# Patient Record
Sex: Male | Born: 1955
Health system: Southern US, Community
[De-identification: ages and names within clinical notes are randomized; demographics above are authoritative.]

## PROBLEM LIST (undated history)

## (undated) DIAGNOSIS — I10 Essential (primary) hypertension: Secondary | ICD-10-CM

## (undated) DIAGNOSIS — Z87891 Personal history of nicotine dependence: Secondary | ICD-10-CM

## (undated) DIAGNOSIS — R079 Chest pain, unspecified: Secondary | ICD-10-CM

## (undated) DIAGNOSIS — J439 Emphysema, unspecified: Secondary | ICD-10-CM

## (undated) DIAGNOSIS — K219 Gastro-esophageal reflux disease without esophagitis: Secondary | ICD-10-CM

## (undated) HISTORY — PX: CHOLECYSTECTOMY: SHX55

## (undated) HISTORY — DX: Emphysema, unspecified: J43.9

## (undated) HISTORY — DX: Gastro-esophageal reflux disease without esophagitis: K21.9

## (undated) HISTORY — DX: Chest pain, unspecified: R07.9

## (undated) HISTORY — DX: Personal history of nicotine dependence: Z87.891

## (undated) HISTORY — PX: FRACTURE SURGERY: SHX138

## (undated) HISTORY — PX: OTHER SURGICAL HISTORY: SHX169

---

## 2006-10-21 ENCOUNTER — Encounter (INDEPENDENT_AMBULATORY_CARE_PROVIDER_SITE_OTHER): Payer: Self-pay | Admitting: General Surgery

## 2006-10-21 ENCOUNTER — Ambulatory Visit (HOSPITAL_COMMUNITY): Admission: RE | Admit: 2006-10-21 | Discharge: 2006-10-21 | Payer: Self-pay | Admitting: General Surgery

## 2007-08-09 ENCOUNTER — Emergency Department (HOSPITAL_COMMUNITY): Admission: EM | Admit: 2007-08-09 | Discharge: 2007-08-09 | Payer: Self-pay | Admitting: Emergency Medicine

## 2009-07-09 ENCOUNTER — Ambulatory Visit: Payer: Self-pay | Admitting: Diagnostic Radiology

## 2009-07-09 ENCOUNTER — Emergency Department (HOSPITAL_BASED_OUTPATIENT_CLINIC_OR_DEPARTMENT_OTHER): Admission: EM | Admit: 2009-07-09 | Discharge: 2009-07-09 | Payer: Self-pay | Admitting: Emergency Medicine

## 2010-07-11 NOTE — Op Note (Signed)
NAME:  Casey James, Casey James               ACCOUNT NO.:  [id number]   MEDICAL RECORD NO.:  [id number]          PATIENT TYPE:  AMB   LOCATION:  SDS                          FACILITY:  [facility]   PHYSICIAN:  Gabrielle Dare. Janee Morn, M.D.DATE OF BIRTH:  02-19-1956   DATE OF PROCEDURE:  10/21/2006  DATE OF DISCHARGE:                               OPERATIVE REPORT   PREOPERATIVE DIAGNOSES:  1. Symptomatic cholelithiasis.  2. Cyst, left posterior neck.   POSTOPERATIVE DIAGNOSES:  1. Symptomatic cholelithiasis.  2. Cyst, left posterior neck.   PROCEDURES:  1. Laparoscopic cholecystectomy with intraoperative cholangiogram.  2. Excision of cyst left posterior neck 5 cm with layered closure.   SURGEON:  Gabrielle Dare. Janee Morn, M.D.   ANESTHESIA:  General.   HISTORY OF PRESENT ILLNESS:  Casey James is a 55 year old gentleman who  I evaluated in the office for right upper quadrant pain.  Workup  included liver function tests, which were normal.  Abdominal ultrasound,  however, showed multiple gallstones.  We planned elective  cholecystectomy.  In addition, he has a cyst on the back of his neck on  the left side that has gotten progressively larger over the years and he  has requested excision of that as well.  He presents for both electively  today.   PROCEDURE IN DETAIL:  Informed consent was obtained.  The patient's cyst  site was marked.  He received intravenous antibiotics.  He was brought  to the operating room.  General anesthesia was administered.  Attention  was first directed to the cholecystectomy.  His abdomen was prepped and  draped in a sterile fashion.  The infraumbilical area was infiltrated  with 0.25% Marcaine with epinephrine and infraumbilical incision was  made.  Subcutaneous tissues were dissected down revealing the anterior  fascia.  This was divided sharply.  The peritoneal cavity was then  entered under direct vision without difficulty.  A 0 Vicryl pursestring  suture was placed  around the fascial opening and the Hasson trocar was  inserted into the abdomen.  The abdomen was insufflated with carbon  dioxide in a standard fashion.  Under direct vision, an 11-mm epigastric  and two 5-mm lateral ports were placed.  The 0.25% Marcaine with  epinephrine was used at all port sites.  The dome of the gallbladder was  retracted superomedially.  There was some filmy omental adhesions taken  down off the body of the gallbladder.  These were swept away without  difficulty revealing the infundibulum.  The infundibulum was retracted  inferolaterally.  Dissection began laterally and progressed medially.  This identified the cystic duct easily and we continued dissecting.  There was a more posterior located cystic artery that was also dissected  and dissection in the cystic duct continued until a large window was  created between the cystic duct, the infundibulum of the gallbladder and  the liver.  Once this was done with excellent visualization, the clip  was placed on the infundibulocystic duct junction.  A small nick was  made in the cystic duct and a Reddick cholangiogram catheter was  inserted.  Intraoperative cholangiogram  was obtained demonstrating no  common bile duct filling defects and good flow of contrast into the  duodenum.  There was a decent length of cystic duct.  Angiogram catheter  was removed.  Three clips were placed proximally on the cystic duct and  it was divided.  The cystic artery that we had previously dissected was  then clipped twice proximally and once distally.  The gallbladder was  dissected off the liver bed with Bovie cautery.  We did find a small  second branch of the cystic artery.  It was clipped twice proximally and  divided distally with cautery.  The gallbladder was taken off the liver  bed with a Bovie cautery and placed in an EndoCatch bag and was taken  out of the abdomen via the infraumbilical port site.  The liver bed was  copiously  irrigated.  Hemostasis was obtained with Bovie cautery.  The  irrigation fluid returned clear.  The irrigation fluid was evacuated and  the liver bed was rechecked and excellent hemostasis was present.  All  clips remained in excellent position.  The remainder of the irrigation  fluid was evacuated and it was clear.  We did recheck the liver bed one  more time and it remained dry with clips in good position.  The ports  were removed under direct vision.  The pneumoperitoneum was released.  The Hasson trocar was removed.  The infraumbilical fascia was closed by  tying a 0 Vicryl pursestring suture.  All 4 wounds were copiously  irrigated and the skin of each was closed with a running 4-0 Vicryl  subcuticular stitch.  Sponge, needle, and instrument counts were  correct.  Benzoin, Steri-Strips and sterile dressings were applied.  The  patient tolerated this portion of the procedure.   He was then undraped.  He was placed in a right lateral position and his  posterior neck was prepped and draped in a sterile fashion.  A  transverse incision was made over the large mass in the posterior neck.  Subcutaneous tissues were dissected down.  This was a large encapsulated  mass.  It was circumferentially dissected without violating it achieving  hemostasis with Bovie cautery.  It was dissected off the fascia  underneath and was removed in one piece.  It was sent to pathology.  The  wound was copiously irrigated.  Meticulous hemostasis was obtained.  The  wound was then closed in layers with subcutaneous tissues approximated  with 3-0 Vicryl sutures in an interrupted fashion.  Tacking the tissues  down to the underlying fascia and subcutaneous tissues were irrigated.  Some initial local anesthetic was injected and the skin was closed with  a running 4-0 Monocryl subcuticular stitch.  Sponge, needle, and  instrument counts were again correct.  Benzoin, Steri-Strips and sterile  dressing were applied  and the patient tolerated the procedure well  without apparent complication, and was taken to the recovery room in  stable condition.      Gabrielle Dare Janee Morn, M.D.  Electronically Signed     BET/MEDQ  D:  10/21/2006  T:  10/21/2006  Job:  [id number]   cc:   Nilda Simmer, M.D.

## 2010-11-23 LAB — POCT I-STAT, CHEM 8
BUN: 11
HCT: 46
Sodium: 137

## 2010-11-23 LAB — POCT CARDIAC MARKERS
Myoglobin, poc: 130
Myoglobin, poc: 252
Operator id: 264421
Operator id: 264421
Troponin i, poc: <0.05

## 2010-12-08 LAB — CBC
HCT: 44.8
MCHC: 34.5
MCV: 91
RDW: 14.6 — ABNORMAL HIGH
WBC: 12.3 — ABNORMAL HIGH

## 2011-07-13 ENCOUNTER — Ambulatory Visit (INDEPENDENT_AMBULATORY_CARE_PROVIDER_SITE_OTHER): Payer: BC Managed Care – PPO | Admitting: Family Medicine

## 2011-07-13 VITALS — BP 132/78 | HR 73 | Temp 98.0°F | Resp 16 | Ht 69.5 in | Wt 188.0 lb

## 2011-07-13 DIAGNOSIS — H02401 Unspecified ptosis of right eyelid: Secondary | ICD-10-CM | POA: Insufficient documentation

## 2011-07-13 DIAGNOSIS — H02409 Unspecified ptosis of unspecified eyelid: Secondary | ICD-10-CM

## 2011-07-13 DIAGNOSIS — R5383 Other fatigue: Secondary | ICD-10-CM

## 2011-07-13 DIAGNOSIS — R5381 Other malaise: Secondary | ICD-10-CM

## 2011-07-13 DIAGNOSIS — Z0000 Encounter for general adult medical examination without abnormal findings: Secondary | ICD-10-CM

## 2011-07-13 DIAGNOSIS — Z23 Encounter for immunization: Secondary | ICD-10-CM

## 2011-07-13 LAB — POCT UA - MICROSCOPIC ONLY
Bacteria, U Microscopic: NEGATIVE
Casts, Ur, LPF, POC: NEGATIVE
Crystals, Ur, HPF, POC: NEGATIVE
Mucus, UA: NEGATIVE
RBC, urine, microscopic: NEGATIVE
WBC, Ur, HPF, POC: NEGATIVE
Yeast, UA: NEGATIVE

## 2011-07-13 LAB — TSH: TSH: 1.615 u[IU]/mL (ref 0.350–4.500)

## 2011-07-13 LAB — POCT URINALYSIS DIPSTICK
Bilirubin, UA: NEGATIVE
Blood, UA: NEGATIVE
Glucose, UA: NEGATIVE
Ketones, UA: NEGATIVE
Leukocytes, UA: NEGATIVE
Nitrite, UA: NEGATIVE
Protein, UA: NEGATIVE
Spec Grav, UA: 1.01
Urobilinogen, UA: 0.2
pH, UA: 6.5

## 2011-07-13 LAB — COMPREHENSIVE METABOLIC PANEL
ALT: 16 U/L (ref 0–53)
AST: 23 U/L (ref 0–37)
Albumin: 4.4 g/dL (ref 3.5–5.2)
Alkaline Phosphatase: 104 U/L (ref 39–117)
BUN: 8 mg/dL (ref 6–23)
CO2: 24 mEq/L (ref 19–32)
Calcium: 9.1 mg/dL (ref 8.4–10.5)
Chloride: 104 mEq/L (ref 96–112)
Creat: 0.95 mg/dL (ref 0.50–1.35)
Glucose, Bld: 85 mg/dL (ref 70–99)
Potassium: 4.3 mEq/L (ref 3.5–5.3)
Sodium: 139 mEq/L (ref 135–145)
Total Bilirubin: 0.7 mg/dL (ref 0.3–1.2)
Total Protein: 7.6 g/dL (ref 6.0–8.3)

## 2011-07-13 LAB — POCT CBC
Granulocyte percent: 61.7 %G (ref 37–80)
HCT, POC: 45.3 % (ref 43.5–53.7)
Hemoglobin: 15.4 g/dL (ref 14.1–18.1)
Lymph, poc: 2 (ref 0.6–3.4)
MCH, POC: 31.4 pg — AB (ref 27–31.2)
MCHC: 34 g/dL (ref 31.8–35.4)
MCV: 92.3 fL (ref 80–97)
MID (cbc): 0.8 (ref 0–0.9)
MPV: 9.6 fL (ref 0–99.8)
POC Granulocyte: 4.5 (ref 2–6.9)
POC LYMPH PERCENT: 27.9 %L (ref 10–50)
POC MID %: 10.4 %M (ref 0–12)
Platelet Count, POC: 228 10*3/uL (ref 142–424)
RBC: 4.91 M/uL (ref 4.69–6.13)
RDW, POC: 13.7 %
WBC: 7.3 10*3/uL (ref 4.6–10.2)

## 2011-07-13 LAB — LIPID PANEL
Cholesterol: 196 mg/dL (ref 0–200)
HDL: 27 mg/dL — ABNORMAL LOW (ref >39)
LDL Cholesterol: 123 mg/dL — ABNORMAL HIGH (ref 0–99)
Total CHOL/HDL Ratio: 7.3 Ratio
Triglycerides: 231 mg/dL — ABNORMAL HIGH (ref <150)
VLDL: 46 mg/dL — ABNORMAL HIGH (ref 0–40)

## 2011-07-13 LAB — TESTOSTERONE: Testosterone: 346.97 ng/dL (ref 300–890)

## 2011-07-13 LAB — PSA: PSA: 1.81 ng/mL (ref <=4.00)

## 2011-07-13 LAB — IFOBT (OCCULT BLOOD): IFOBT: NEGATIVE

## 2011-07-13 MED ORDER — TETANUS-DIPHTH-ACELL PERTUSSIS 5-2.5-18.5 LF-MCG/0.5 IM SUSP: 0.5000 mL | Freq: Once | INTRAMUSCULAR | Status: DC

## 2011-07-13 NOTE — Patient Instructions (Addendum)
Health Maintenance, Males A healthy lifestyle and preventative care can promote health and wellness.  Maintain regular health, dental, and eye exams.   Eat a healthy diet. Foods like vegetables, fruits, whole grains, low-fat dairy products, and lean protein foods contain the nutrients you need without too many calories. Decrease your intake of foods high in solid fats, added sugars, and salt. Get information about a proper diet from your caregiver, if necessary.   Regular physical exercise is one of the most important things you can do for your health. Most adults should get at least 150 minutes of moderate-intensity exercise (any activity that increases your heart rate and causes you to sweat) each week. In addition, most adults need muscle-strengthening exercises on 2 or more days a week.    Maintain a healthy weight. The body mass index (BMI) is a screening tool to identify possible weight problems. It provides an estimate of body fat based on height and weight. Your caregiver can help determine your BMI, and can help you achieve or maintain a healthy weight. For adults 20 years and older:   A BMI below 18.5 is considered underweight.   A BMI of 18.5 to 24.9 is normal.   A BMI of 25 to 29.9 is considered overweight.   A BMI of 30 and above is considered obese.   Maintain normal blood lipids and cholesterol by exercising and minimizing your intake of saturated fat. Eat a balanced diet with plenty of fruits and vegetables. Blood tests for lipids and cholesterol should begin at age 20 and be repeated every 5 years. If your lipid or cholesterol levels are high, you are over 50, or you are a high risk for heart disease, you may need your cholesterol levels checked more frequently.Ongoing high lipid and cholesterol levels should be treated with medicines, if diet and exercise are not effective.   If you smoke, find out from your caregiver how to quit. If you do not use tobacco, do not start.    If you choose to drink alcohol, do not exceed 2 drinks per day. One drink is considered to be 12 ounces (355 mL) of beer, 5 ounces (148 mL) of wine, or 1.5 ounces (44 mL) of liquor.   Avoid use of street drugs. Do not share needles with anyone. Ask for help if you need support or instructions about stopping the use of drugs.   High blood pressure causes heart disease and increases the risk of stroke. Blood pressure should be checked at least every 1 to 2 years. Ongoing high blood pressure should be treated with medicines if weight loss and exercise are not effective.   If you are 45 to 57 years old, ask your caregiver if you should take aspirin to prevent heart disease.   Diabetes screening involves taking a blood sample to check your fasting blood sugar level. This should be done once every 3 years, after age 45, if you are within normal weight and without risk factors for diabetes. Testing should be considered at a younger age or be carried out more frequently if you are overweight and have at least 1 risk factor for diabetes.   Colorectal cancer can be detected and often prevented. Most routine colorectal cancer screening begins at the age of 50 and continues through age 75. However, your caregiver may recommend screening at an earlier age if you have risk factors for colon cancer. On a yearly basis, your caregiver may provide home test kits to check for hidden   blood in the stool. Use of a small camera at the end of a tube, to directly examine the colon (sigmoidoscopy or colonoscopy), can detect the earliest forms of colorectal cancer. Talk to your caregiver about this at age 71, when routine screening begins. Direct examination of the colon should be repeated every 5 to 10 years through age 34, unless early forms of pre-cancerous polyps or small growths are found.   Hepatitis C blood testing is recommended for all people born from [birth year] through 1966 and any individual with known risks for  hepatitis C.   Healthy men should no longer receive prostate-specific antigen (PSA) blood tests as part of routine cancer screening. Consult with your caregiver about prostate cancer screening.   Testicular cancer screening is not recommended for adolescents or adult males who have no symptoms. Screening includes self-exam, caregiver exam, and other screening tests. Consult with your caregiver about any symptoms you have or any concerns you have about testicular cancer.   Practice safe sex. Use condoms and avoid high-risk sexual practices to reduce the spread of sexually transmitted infections (STIs).   Use sunscreen with a sun protection factor (SPF) of 30 or greater. Apply sunscreen liberally and repeatedly throughout the day. You should seek shade when your shadow is shorter than you. Protect yourself by wearing long sleeves, pants, a wide-brimmed hat, and sunglasses year round, whenever you are outdoors.   Notify your caregiver of new moles or changes in moles, especially if there is a change in shape or color. Also notify your caregiver if a mole is larger than the size of a pencil eraser.   A one-time screening for abdominal aortic aneurysm (AAA) and surgical repair of large AAAs by sound wave imaging (ultrasonography) is recommended for ages 69 to 39 years who are current or former smokers.   Stay current with your immunizations.  Document Released: 08/11/2007 Document Revised: 02/01/2011 Document Reviewed: 07/10/2010 [facility] Patient Information 2012 [place], [place].Fatigue Fatigue is a feeling of tiredness, lack of energy, lack of motivation, or feeling tired all the time. Having enough rest, good nutrition, and reducing stress will normally reduce fatigue. Consult your caregiver if it persists. The nature of your fatigue will help your caregiver to find out its cause. The treatment is based on the cause.  CAUSES  There are many causes for fatigue. Most of the time, fatigue can be traced  to one or more of your habits or routines. Most causes fit into one or more of three general areas. They are: Lifestyle problems  Sleep disturbances.   Overwork.   Physical exertion.   Unhealthy habits.   Poor eating habits or eating disorders.   Alcohol and/or drug use .   Lack of proper nutrition (malnutrition).  Psychological problems  Stress and/or anxiety problems.   Depression.   Grief.   Boredom.  Medical Problems or Conditions  Anemia.   Pregnancy.   Thyroid gland problems.   Recovery from major surgery.   Continuous pain.   Emphysema or asthma that is not well controlled   Allergic conditions.   Diabetes.   Infections (such as mononucleosis).   Obesity.   Sleep disorders, such as sleep apnea.   Heart failure or other heart-related problems.   Cancer.   Kidney disease.   Liver disease.   Effects of certain medicines such as antihistamines, cough and cold remedies, prescription pain medicines, heart and blood pressure medicines, drugs used for treatment of cancer, and some antidepressants.  SYMPTOMS  The symptoms  of fatigue include:   Lack of energy.   Lack of drive (motivation).   Drowsiness.   Feeling of indifference to the surroundings.  DIAGNOSIS  The details of how you feel help guide your caregiver in finding out what is causing the fatigue. You will be asked about your present and past health condition. It is important to review all medicines that you take, including prescription and non-prescription items. A thorough exam will be done. You will be questioned about your feelings, habits, and normal lifestyle. Your caregiver may suggest blood tests, urine tests, or other tests to look for common medical causes of fatigue.  TREATMENT  Fatigue is treated by correcting the underlying cause. For example, if you have continuous pain or depression, treating these causes will improve how you feel. Similarly, adjusting the dose of certain  medicines will help in reducing fatigue.  HOME CARE INSTRUCTIONS   Try to get the required amount of good sleep every night.   Eat a healthy and nutritious diet, and drink enough water throughout the day.   Practice ways of relaxing (including yoga or meditation).   Exercise regularly.   Make plans to change situations that cause stress. Act on those plans so that stresses decrease over time. Keep your work and personal routine reasonable.   Avoid street drugs and minimize use of alcohol.   Start taking a daily multivitamin after consulting your caregiver.  SEEK MEDICAL CARE IF:   You have persistent tiredness, which cannot be accounted for.   You have fever.   You have unintentional weight loss.   You have headaches.   You have disturbed sleep throughout the night.   You are feeling sad.   You have constipation.   You have dry skin.   You have gained weight.   You are taking any new or different medicines that you suspect are causing fatigue.   You are unable to sleep at night.   You develop any unusual swelling of your legs or other parts of your body.  SEEK IMMEDIATE MEDICAL CARE IF:   You are feeling confused.   Your vision is blurred.   You feel faint or pass out.   You develop severe headache.   You develop severe abdominal, pelvic, or back pain.   You develop chest pain, shortness of breath, or an irregular or fast heartbeat.   You are unable to pass a normal amount of urine.   You develop abnormal bleeding such as bleeding from the rectum or you vomit blood.   You have thoughts about harming yourself or committing suicide.   You are worried that you might harm someone else.  MAKE SURE YOU:   Understand these instructions.   Will watch your condition.   Will get help right away if you are not doing well or get worse.  Document Released: 12/10/2006 Document Revised: 02/01/2011 Document Reviewed: 12/10/2006 [facility] Patient Information 2012  [place], [place].

## 2011-07-13 NOTE — Progress Notes (Signed)
  Patient Name: Casey James Date of Birth: 1955/10/10 Medical Record Number: [id number] Gender: male Date of Encounter: 07/13/2011  History of Present Illness:  Casey James is a 56 y.o. very pleasant male patient who presents with the following:  3 months Erectile dysfunction Ptosis which has been progressive over his entire life, right eye, also present in mother Posey Rea of last tetanus shot Refuses colonoscopy at this point, we'll consider when he has different insurance in the fall  Patient Active Problem List  Diagnoses  . Ptosis of eyelid, right   No past medical history on file. Past Surgical History  Procedure Date  . Cholecystectomy   . Fracture surgery   . Pulmonary fibrosis    History  Substance Use Topics  . Smoking status: Current Everyday Smoker  . Smokeless tobacco: Not on file  . Alcohol Use: Not on file   Family History  Problem Relation Age of Onset  . Cancer Mother   . Pulmonary fibrosis Mother   . Diabetes Brother   . Heart disease Father    No Known Allergies  Medication list has been reviewed and updated.  Review of Systems: Negative except for above  Physical Examination: Filed Vitals:   07/13/11 1316  BP: 132/78  Pulse: 73  Temp: 98 F (36.7 C)  TempSrc: Oral  Resp: 16  Height: 5' 9.5" (1.765 m)  Weight: 188 lb (85.276 kg)    Body mass index is 27.36 kg/(m^2).  Physical Examination: General appearance - alert, well appearing, and in no distress Mental status - alert, oriented to person, place, and time Eyes - pupils equal and reactive, extraocular eye movements intact, ptosis right eye Ears - bilateral TM's and external ear canals normal Nose - normal and patent, no erythema, discharge or polyps Mouth - mucous membranes moist, pharynx normal without lesions Neck - supple, no significant adenopathy Lymphatics - no palpable lymphadenopathy, no hepatosplenomegaly Chest - clear to auscultation, no wheezes, rales or rhonchi,  symmetric air entry Heart - normal rate, regular rhythm, normal S1, S2, no murmurs, rubs, clicks or gallops Abdomen - soft, nontender, nondistended, no masses or organomegaly GU Male - no penile lesions or discharge, no testicular masses or tenderness, no hernias Rectal - negative without mass, lesions or tenderness,circumcised.  Note:  Small subq nontender nodule right perineum Back exam - full range of motion, no tenderness, palpable spasm or pain on motion Neurological - alert, oriented, normal speech, no focal findings or movement disorder noted Musculoskeletal - no joint tenderness, deformity or swelling Extremities - peripheral pulses normal, no pedal edema, no clubbing or cyanosis Skin - normal coloration and turgor, no rashes, no suspicious skin lesions noted  Assessment and Plan: 1. Healthcare maintenance  TDaP (BOOSTRIX) injection 0.5 mL, POCT CBC, IFOBT POC (occult bld, rslt in office), POCT urinalysis dipstick, POCT UA - Microscopic Only, PSA, Comprehensive metabolic panel, Lipid panel  2. Ptosis of eyelid, right    3. Fatigue  Lipid panel, Testosterone

## 2012-07-16 ENCOUNTER — Ambulatory Visit (INDEPENDENT_AMBULATORY_CARE_PROVIDER_SITE_OTHER): Payer: BC Managed Care – PPO | Admitting: Family Medicine

## 2012-07-16 ENCOUNTER — Encounter: Payer: Self-pay | Admitting: Family Medicine

## 2012-07-16 VITALS — BP 140/70 | HR 69 | Temp 97.8°F | Resp 16 | Ht 69.5 in | Wt 178.2 lb

## 2012-07-16 DIAGNOSIS — E291 Testicular hypofunction: Secondary | ICD-10-CM

## 2012-07-16 DIAGNOSIS — R5383 Other fatigue: Secondary | ICD-10-CM

## 2012-07-16 DIAGNOSIS — R5381 Other malaise: Secondary | ICD-10-CM

## 2012-07-16 DIAGNOSIS — Z0000 Encounter for general adult medical examination without abnormal findings: Secondary | ICD-10-CM

## 2012-07-16 DIAGNOSIS — D234 Other benign neoplasm of skin of scalp and neck: Secondary | ICD-10-CM

## 2012-07-16 DIAGNOSIS — D2330 Other benign neoplasm of skin of unspecified part of face: Secondary | ICD-10-CM

## 2012-07-16 LAB — POCT URINALYSIS DIPSTICK
Bilirubin, UA: NEGATIVE
Blood, UA: NEGATIVE
Glucose, UA: NEGATIVE
Ketones, UA: NEGATIVE
Leukocytes, UA: NEGATIVE
Nitrite, UA: NEGATIVE
Protein, UA: NEGATIVE
Spec Grav, UA: <=1.005
Urobilinogen, UA: 0.2
pH, UA: 7

## 2012-07-16 LAB — LIPID PANEL
Cholesterol: 207 mg/dL — ABNORMAL HIGH (ref 0–200)
HDL: 36 mg/dL — ABNORMAL LOW (ref >39)
LDL Cholesterol: 135 mg/dL — ABNORMAL HIGH (ref 0–99)
Total CHOL/HDL Ratio: 5.8 Ratio
Triglycerides: 178 mg/dL — ABNORMAL HIGH (ref <150)
VLDL: 36 mg/dL (ref 0–40)

## 2012-07-16 LAB — CBC WITH DIFFERENTIAL/PLATELET
Basophils Absolute: 0 10*3/uL (ref 0.0–0.1)
Basophils Relative: 0 % (ref 0–1)
Eosinophils Absolute: 0.1 10*3/uL (ref 0.0–0.7)
Eosinophils Relative: 1 % (ref 0–5)
HCT: 45.3 % (ref 39.0–52.0)
Hemoglobin: 15.7 g/dL (ref 13.0–17.0)
Lymphocytes Relative: 21 % (ref 12–46)
Lymphs Abs: 2.4 10*3/uL (ref 0.7–4.0)
MCH: 30.7 pg (ref 26.0–34.0)
MCHC: 34.7 g/dL (ref 30.0–36.0)
MCV: 88.5 fL (ref 78.0–100.0)
Monocytes Absolute: 0.6 10*3/uL (ref 0.1–1.0)
Monocytes Relative: 5 % (ref 3–12)
Neutro Abs: 8 10*3/uL — ABNORMAL HIGH (ref 1.7–7.7)
Neutrophils Relative %: 73 % (ref 43–77)
Platelets: 194 10*3/uL (ref 150–400)
RBC: 5.12 MIL/uL (ref 4.22–5.81)
RDW: 14.6 % (ref 11.5–15.5)
WBC: 11.1 10*3/uL — ABNORMAL HIGH (ref 4.0–10.5)

## 2012-07-16 LAB — COMPREHENSIVE METABOLIC PANEL
ALT: 16 U/L (ref 0–53)
AST: 20 U/L (ref 0–37)
Albumin: 4.3 g/dL (ref 3.5–5.2)
Alkaline Phosphatase: 70 U/L (ref 39–117)
BUN: 8 mg/dL (ref 6–23)
CO2: 22 mEq/L (ref 19–32)
Calcium: 9.2 mg/dL (ref 8.4–10.5)
Chloride: 107 mEq/L (ref 96–112)
Creat: 0.87 mg/dL (ref 0.50–1.35)
Glucose, Bld: 98 mg/dL (ref 70–99)
Potassium: 4 mEq/L (ref 3.5–5.3)
Sodium: 140 mEq/L (ref 135–145)
Total Bilirubin: 0.9 mg/dL (ref 0.3–1.2)
Total Protein: 7 g/dL (ref 6.0–8.3)

## 2012-07-16 LAB — IFOBT (OCCULT BLOOD): IFOBT: NEGATIVE

## 2012-07-16 MED ORDER — TADALAFIL 20 MG PO TABS: 20.0000 mg | ORAL_TABLET | Freq: Every day | ORAL | Status: DC | PRN

## 2012-07-16 NOTE — Progress Notes (Signed)
Patient ID: Casey James MRN: [id number], DOB: 1955-06-14 57 y.o. Date of Encounter: 07/16/2012, 1:52 PM  Primary Physician: No primary provider on file.  Chief Complaint: Physical (CPE)  HPI: 57 y.o. y/o male with history noted below here for CPE.  Doing well. 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 Eyes: No visual changes, eye redness, or discharge. ENT/Mouth: Ears: No otalgia, tinnitus, hearing loss, discharge. Nose: No congestion, rhinorrhea, sinus pain, or epistaxis. Throat: No sore throat, post nasal drip, or teeth pain. Cardiovascular: No CP, palpitations, diaphoresis, DOE, edema, orthopnea, PND. Respiratory: No cough, hemoptysis, SOB, or wheezing. Gastrointestinal: No anorexia, dysphagia, reflux, pain, nausea, vomiting, hematemesis, diarrhea, constipation, BRBPR, or melena. Genitourinary: No dysuria, frequency, urgency, hematuria, incontinence,  decreased urinary stream, discharge, or testicular pain/masses.  Cialis works but very pricey Musculoskeletal: No decreased ROM, myalgias, stiffness, joint swelling, or weakness. Skin: No rash, erythema, pain, warmth, jaundice, or pruritis. Neurological: No headache, dizziness, syncope, seizures, tremors, memory loss, coordination problems, or paresthesias. Psychological: No anxiety, depression, hallucinations, SI/HI. Endocrine: No fatigue, polydipsia, polyphagia, polyuria, or known diabetes. All other systems were reviewed and are otherwise negative.  Past Medical History  Diagnosis Date  . Emphysema of lung      Past Surgical History  Procedure Laterality Date  . Cholecystectomy    . Fracture surgery    . Pulmonary fibrosis      Home Meds:  Prior to Admission medications   Medication Sig  Start Date End Date Taking? Authorizing Provider  Multiple Vitamins-Minerals (MULTIVITAMIN WITH IRON-MINERALS) liquid Take by mouth daily.   Yes Historical Provider, MD  tadalafil (CIALIS) 20 MG tablet Take 20 mg by mouth daily as needed for erectile dysfunction.   Yes Historical Provider, MD    Allergies: No Known Allergies  History   Social History  . Marital Status: Married    Spouse Name: N/A    Number of Children: N/A  . Years of Education: N/A   Occupational History  . Not on file.   Social History Main Topics  . Smoking status: Current Every Day Smoker  . Smokeless tobacco: Not on file  . Alcohol Use: Not on file  . Drug Use: Not on file  . Sexually Active: Not on file   Other Topics Concern  . Not on file   Social History Narrative  . No narrative on file    Family History  Problem Relation Age of Onset  . Cancer Mother   . Pulmonary fibrosis Mother   . Diabetes Brother   . Heart disease Father     Physical Exam: Blood pressure 140/70, pulse 69, temperature 97.8 F (36.6 C), temperature source Oral, resp. rate 16, height 5' 9.5" (1.765 m), weight 178 lb 3.2 oz (80.831 kg), SpO2 97.00%.  General: Well developed, well nourished, in no acute distress. HEENT: Normocephalic, atraumatic. Conjunctiva pink, sclera non-icteric. Pupils 2 mm constricting to 1 mm, round, regular, and equally reactive to light and accomodation. EOMI. Internal auditory canal clear. TMs with good cone of light and without pathology. Nasal mucosa pink. Nares are without discharge. No sinus tenderness. Oral mucosa pink. Dentition poor, plate and missing teeth. Pharynx without exudate.   Neck: Supple. Trachea midline. No thyromegaly. Full ROM. No lymphadenopathy. Lungs: Clear to auscultation bilaterally without wheezes, rales, or  rhonchi. Breathing is of normal effort and unlabored. Cardiovascular: RRR with S1 S2. No murmurs, rubs, or gallops appreciated. Distal pulses 2+ symmetrically. No  carotid or abdominal bruits Abdomen: Soft, non-tender, non-distended with normoactive bowel sounds. No hepatosplenomegaly or masses. No rebound/guarding. No CVA tenderness. Without hernias.  Rectal: No external hemorrhoids or fissures. Rectal vault without masses.  Genitourinary:  circumcised male. No penile lesions. Testes descended bilaterally, and smooth without tenderness or masses.  Musculoskeletal: Full range of motion and 5/5 strength throughout. Without swelling, atrophy, tenderness, crepitus, or warmth. Extremities without clubbing, cyanosis, or edema. Calves supple. Skin: Warm and moist without erythema, ecchymosis, wounds, or rash.  1 mm rough area right temple and 5 mm rough area posterior neck Neuro: A+Ox3. CN II-XII grossly intact. Moves all extremities spontaneously. Full sensation throughout. Normal gait. DTR 2+ throughout upper and lower extremities. Finger to nose intact. Psych:  Responds to questions appropriately with a normal affect.   UA:  Results for orders placed in visit on 07/16/12  POCT URINALYSIS DIPSTICK      Result Value Range   Color, UA Yellow     Clarity, UA Clear     Glucose, UA Negative     Bilirubin, UA Negative     Ketones, UA Negative     Spec Grav, UA <=1.005     Blood, UA Negative     pH, UA 7.0     Protein, UA Negative     Urobilinogen, UA 0.2     Nitrite, UA Negative     Leukocytes, UA Negative    IFOBT (OCCULT BLOOD)      Result Value Range   IFOBT Negative       Assessment/Plan:  57 y.o. y/o  male here for CPE -Routine general medical examination at a health care facility - Plan: PSA, Comprehensive metabolic panel, Lipid panel, Testosterone, free, total, TSH, CBC with Differential, POCT urinalysis dipstick, IFOBT POC (occult bld, rslt in office)  Other malaise and fatigue - Plan: PSA, Comprehensive metabolic panel, Lipid panel, Testosterone, free, total, TSH, CBC with Differential, POCT urinalysis dipstick, IFOBT POC (occult bld, rslt in  office)  Hypogonadism male - Plan: PSA, Comprehensive metabolic panel, Lipid panel, Testosterone, free, total, TSH, CBC with Differential, POCT urinalysis dipstick, IFOBT POC (occult bld, rslt in office)  Urged to get colonoscopy.  Patient will think about it  I cryo'd the two areas in question:  Right temple and posterior cervical lesions.  Signed, Elvina Sidle, MD 07/16/2012 1:52 PM

## 2012-07-17 LAB — PSA: PSA: 1.75 ng/mL (ref <=4.00)

## 2012-07-17 LAB — TESTOSTERONE, FREE, TOTAL, SHBG
Sex Hormone Binding: 36 nmol/L (ref 13–71)
Testosterone, Free: 66.4 pg/mL (ref 47.0–244.0)
Testosterone-% Free: 1.9 % (ref 1.6–2.9)
Testosterone: 350 ng/dL (ref 300–890)

## 2012-07-17 LAB — TSH: TSH: 1.31 u[IU]/mL (ref 0.350–4.500)

## 2019-06-23 ENCOUNTER — Ambulatory Visit: Payer: Medicare Other | Attending: Internal Medicine | Admitting: Internal Medicine

## 2019-06-23 ENCOUNTER — Other Ambulatory Visit: Payer: Self-pay

## 2019-06-23 VITALS — Ht 69.0 in

## 2019-06-23 DIAGNOSIS — R079 Chest pain, unspecified: Secondary | ICD-10-CM

## 2019-06-23 DIAGNOSIS — K219 Gastro-esophageal reflux disease without esophagitis: Secondary | ICD-10-CM

## 2019-06-23 DIAGNOSIS — Z87891 Personal history of nicotine dependence: Secondary | ICD-10-CM

## 2019-06-23 MED ORDER — OMEPRAZOLE 20 MG PO CPDR: 20.0000 mg | DELAYED_RELEASE_CAPSULE | Freq: Two times a day (BID) | ORAL | 3 refills | Status: DC

## 2019-06-23 NOTE — Progress Notes (Signed)
Pt states he has been having a lot of acid reflux

## 2019-06-23 NOTE — Progress Notes (Signed)
Virtual Visit via Telephone Note Due to current restrictions/limitations of in-office visits due to the COVID-19 pandemic, this scheduled clinical appointment was converted to a telehealth visit  I connected with Casey James on 06/23/19 at  1:50 PM EDT by telephone and verified that I am speaking with the correct person using two identifiers. I am in my office.  The patient is at home.  Only the patient and myself participated in this encounter.  I discussed the limitations, risks, security and privacy concerns of performing an evaluation and management service by telephone and the availability of in person appointments. I also discussed with the patient that there may be a patient responsible charge related to this service. The patient expressed understanding and agreed to proceed.   History of Present Illness: Pt wanting to est care. No previous PCP  Pt c/o burning in the chest that is getting progressively worse. Going on x 1 yr. Worse when active bending over, lifting and sometimes with walking.  Sometimes it goes to LT arm.  Better with sitting and rest. Also comes on with meals at times. Down to eating 1 meal a day because of it.  The burning occurs with foods like pizza and spaghetti so he has stopped eating those.  He tried Zantac and over-the-counter Prilosec last year.  He states that while he was taking the medicines the episodes of burning in the chest was less but returned soon after he stopped the medicines.  He quit smoking in August of last year and quit drinking about 3 months ago.  Family history of coronary artery disease in his father who was in his 45s when he had 2-3 stents placed.  Currently not on any medications.   Observations/Objective: No direct observation done as this was a telephone encounter.  Assessment and Plan: 1. Chest pain in adult He definitely has acid reflux but some of his history is also concerning for possible coronary artery disease.  I will  refer to cardiology for evaluation.  In the meantime I have recommended taking an enteric-coated baby aspirin 81 mg daily which she will purchase over-the-counter. - Ambulatory referral to Cardiology  2. Gastroesophageal reflux disease without esophagitis GERD precautions given.  He is not on any NSAIDs. Advised to avoid acidic foods like tomato-based foods, orange juice.  Avoid spicy foods.  Advised to eat last meal at least 2 to 3 hours before laying down at night sent to sleep with his head slightly elevated.  Start omeprazole 20 mg twice daily. - omeprazole (PRILOSEC) 20 MG capsule; Take 1 capsule (20 mg total) by mouth 2 (two) times daily before a meal.  Dispense: 60 capsule; Refill: 3  3. Former smoker Commended him on quitting.  Encouraged him to remain tobacco free.  Less than 5 minutes spent on counseling.   Follow Up Instructions: 1 mth   I discussed the assessment and treatment plan with the patient. The patient was provided an opportunity to ask questions and all were answered. The patient agreed with the plan and demonstrated an understanding of the instructions.   The patient was advised to call back or seek an in-person evaluation if the symptoms worsen or if the condition fails to improve as anticipated.  I provided 17 minutes of non-face-to-face time during this encounter.   Karle Plumber, MD

## 2019-07-21 ENCOUNTER — Encounter: Payer: Self-pay | Admitting: Cardiology

## 2019-07-21 ENCOUNTER — Ambulatory Visit: Payer: Medicare Other | Admitting: Cardiology

## 2019-07-21 ENCOUNTER — Other Ambulatory Visit: Payer: Self-pay

## 2019-07-21 VITALS — BP 140/80 | HR 59 | Ht 69.0 in | Wt 206.0 lb

## 2019-07-21 DIAGNOSIS — K219 Gastro-esophageal reflux disease without esophagitis: Secondary | ICD-10-CM | POA: Diagnosis not present

## 2019-07-21 DIAGNOSIS — R079 Chest pain, unspecified: Secondary | ICD-10-CM | POA: Diagnosis not present

## 2019-07-21 DIAGNOSIS — I209 Angina pectoris, unspecified: Secondary | ICD-10-CM

## 2019-07-21 DIAGNOSIS — Z87891 Personal history of nicotine dependence: Secondary | ICD-10-CM | POA: Diagnosis not present

## 2019-07-21 MED ORDER — METOPROLOL TARTRATE 50 MG PO TABS
50.0000 mg | ORAL_TABLET | Freq: Once | ORAL | 0 refills | Status: DC
Start: 2019-07-21 — End: 2019-09-07

## 2019-07-21 NOTE — Patient Instructions (Addendum)
Medication Instructions:  The current medical regimen is effective;  continue present plan and medications.  *If you need a refill on your cardiac medications before your next appointment, please call your pharmacy*  Testing/Procedures: Your physician has requested that you have Coronary CT which  is a painless test that uses an x-ray machine to take clear, detailed pictures of your heart.  Please follow instruction sheet as given.  Follow-Up: At [facility], you and your health needs are our priority.  As part of our continuing mission to provide you with exceptional heart care, we have created designated Provider Care Teams.  These Care Teams include your primary Cardiologist (physician) and Advanced Practice Providers (APPs -  Physician Assistants and Nurse Practitioners) who all work together to provide you with the care you need, when you need it.  We recommend signing up for the patient portal called "MyChart".  Sign up information is provided on this After Visit Summary.  MyChart is used to connect with patients for Virtual Visits (Telemedicine).  Patients are able to view lab/test results, encounter notes, upcoming appointments, etc.  Non-urgent messages can be sent to your provider as well.   To learn more about what you can do with MyChart, go to [email or web address].    Follow up will be based on the results of the above testing.  Thank you for choosing [facility]!!    Your cardiac CT will be scheduled at:   [facility] [address] [place], [place] [address] [phone]  Please arrive at the [facility] main entrance of [facility] 30 minutes prior to test start time. Proceed to the [facility] Radiology Department (first floor) to check-in and test prep.  Please follow these instructions carefully (unless otherwise directed):  Hold all erectile dysfunction medications at least 3 days (72 hrs) prior to test.  On the Night Before  the Test: . Be sure to Drink plenty of water. . Do not consume any caffeinated/decaffeinated beverages or chocolate 12 hours prior to your test. . Do not take any antihistamines 12 hours prior to your test.  On the Day of the Test: . Drink plenty of water. Do not drink any water within one hour of the test. . Do not eat any food 4 hours prior to the test. . You may take your regular medications prior to the test.  . Take metoprolol (Lopressor 50 mg) two hours prior to test. . HOLD Furosemide/Hydrochlorothiazide morning of the test. . FEMALES- please wear underwire-free bra if available   Test: . Drink plenty of water. . After receiving IV contrast, you may experience a mild flushed feeling. This is normal. . On occasion, you may experience a mild rash up to 24 hours after the test. This is not dangerous. If this occurs, you can take Benadryl 25 mg and increase your fluid intake. . If you experience trouble breathing, this can be serious. If it is severe call 911 IMMEDIATELY. If it is mild, please call our office.  Once we have confirmed authorization from your insurance company, we will call you to set up a date and time for your test.   For non-scheduling related questions, please contact the cardiac imaging nurse navigator should you have any questions/concerns: Marchia Bond, Cardiac Imaging Nurse Navigator Burley Saver, Interim Cardiac Imaging Nurse [facility] and Vascular Services Direct Office Dial: [phone]   For scheduling needs, including cancellations and rescheduling, please call [phone].

## 2019-07-21 NOTE — Progress Notes (Signed)
Cardiology Office Note:    Date:  07/21/2019   ID:  Casey James, DOB 10-06-1955, MRN [id number]  PCP:  Ladell Pier, MD  Cardiologist:  Candee Furbish, MD  Electrophysiologist:  None   Referring MD: Ladell Pier, MD     History of Present Illness:    Casey James is a 64 y.o. male here for the evaluation of chest pain at the request of Dr. Wynetta Emery.  In review of her recent office visit on 06/23/2019, was feeling some burning sensation in chest that seem to be progressively getting worse over the past year, perhaps worse when bending over or lifting or walking with occasional radiation to left arm.  When resting, seems to improve.  Worse in the morning. If active needs to sit down. Painting house. Might start up again. Rest of day.   Sometimes will notice this after eating.  In fact, reduced the frequency of eating down to about 1 meal of day because of it.  If tomato based foods such as pizza or spaghetti or eaten, this seems to exacerbate it.  He has stopped eating these types of foods.  Tried PPI as well as H2 blocker.  May have helped a little bit but it returned after stopping the medication.  Was a smoker, quit in August 2020.  Also quit alcohol about 4 months ago.  Has a family history of coronary artery disease, father in his 64s had stents placed.  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  Past Medical History:  Diagnosis Date  . Chest pain   . Emphysema of lung ([place])   . Former smoker   . GERD (gastroesophageal reflux disease)     Past Surgical History:  Procedure Laterality Date  . CHOLECYSTECTOMY    . FRACTURE SURGERY    . pulmonary fibrosis      Current Medications: Current Meds  Medication Sig  . aspirin EC 81 MG tablet Take 81 mg by mouth daily.  . Multiple Vitamins-Minerals (MULTIVITAMIN WITH IRON-MINERALS) liquid Take by mouth daily.  Marland Kitchen omeprazole (PRILOSEC) 20 MG capsule Take 1  capsule (20 mg total) by mouth 2 (two) times daily before a meal.  . tadalafil (CIALIS) 20 MG tablet Take 1 tablet (20 mg total) by mouth daily as needed for erectile dysfunction.     Allergies:   Patient has no known allergies.   Social History   Socioeconomic History  . Marital status: Married    Spouse name: Not on file  . Number of children: Not on file  . Years of education: Not on file  . Highest education level: Not on file  Occupational History  . Not on file  Tobacco Use  . Smoking status: Current Every Day Smoker  . Smokeless tobacco: Never Used  Substance and Sexual Activity  . Alcohol use: Not on file  . Drug use: Not on file  . Sexual activity: Not on file  Other Topics Concern  . Not on file  Social History Narrative  . Not on file   Social Determinants of Health   Financial Resource Strain:   . Difficulty of Paying Living Expenses:   Food Insecurity:   . Worried About Charity fundraiser in the Last Year:   . Arboriculturist in the Last Year:   Transportation Needs:   . Film/video editor (Medical):   Marland Kitchen Lack of Transportation (Non-Medical):   Physical  Activity:   . Days of Exercise per Week:   . Minutes of Exercise per Session:   Stress:   . Feeling of Stress :   Social Connections:   . Frequency of Communication with Friends and Family:   . Frequency of Social Gatherings with Friends and Family:   . Attends Religious Services:   . Active Member of Clubs or Organizations:   . Attends Archivist Meetings:   Marland Kitchen Marital Status:      Family History: The patient's family history includes Cancer in his mother; Diabetes in his brother; Heart disease in his father; Pulmonary fibrosis in his mother.  ROS:   Please see the history of present illness.    No fevers chills nausea vomiting syncope bleeding all other systems reviewed and are negative.  EKGs/Labs/Other Studies Reviewed:    The following studies were reviewed today: EKG as  below  EKG:  EKG is  ordered today.  The ekg ordered today demonstrates sinus bradycardia 59 with nonspecific ST-T wave flattening.  Recent Labs: No results found for requested labs within last 8760 hours.  Recent Lipid Panel    Component Value Date/Time   CHOL 207 (H) 07/16/2012 1359   TRIG 178 (H) 07/16/2012 1359   HDL 36 (L) 07/16/2012 1359   CHOLHDL 5.8 07/16/2012 1359   VLDL 36 07/16/2012 1359   LDLCALC 135 (H) 07/16/2012 1359    Physical Exam:    VS:  BP 140/80   Pulse (!) 59   Ht 5' 9"  (1.753 m)   Wt 206 lb (93.4 kg)   SpO2 97%   BMI 30.42 kg/m     Wt Readings from Last 3 Encounters:  07/21/19 206 lb (93.4 kg)  07/16/12 178 lb 3.2 oz (80.8 kg)  07/13/11 188 lb (85.3 kg)     GEN:  Well nourished, well developed in no acute distress HEENT: Normal NECK: No JVD; No carotid bruits LYMPHATICS: No lymphadenopathy CARDIAC: RRR, no murmurs, rubs, gallops RESPIRATORY:  Clear to auscultation without rales, wheezing or rhonchi  ABDOMEN: Soft, non-tender, non-distended MUSCULOSKELETAL:  No edema; No deformity  SKIN: Warm and dry NEUROLOGIC:  Alert and oriented x 3 PSYCHIATRIC:  Normal affect   ASSESSMENT:    1. Angina pectoris ([place])   2. Chest pain, unspecified type   3. Former smoker   4. Gastroesophageal reflux disease without esophagitis    PLAN:    In order of problems listed above:  Angina/chest discomfort -Risk factors include age, 62-year smoking history, father with CAD, hyperlipidemia.  He is nondiabetic.  He has quit smoking recently. -Given the exertional component, we will go ahead and proceed with coronary CT scan with possible FFR analysis.  Currently taking aspirin 81 mg. -Obviously if there is coronary calcification present, would recommend statin therapy.  GERD -Certainly does have some GERD component to his symptoms however there are some worrisome potential cardiac findings as well.  He is continue with proton pump inhibitor.  If cardiac  work-up were to be unremarkable, would recommend further GI evaluation.  Former smoker -Quit smoking August 2020.  Excellent.  50 pack years previously.  He is going to be getting blood work in the next several days with Dr. Wynetta Emery his primary care physician.  Definitely obtain lipid panel.  She will be likely obtaining a complete metabolic profile as well.  He will need to have this prior to his CT scan.  I asked him to check his blood pressure periodically at home.  Home blood pressure cuff will be helpful.  Today mildly elevated in the office setting.  Has not not been diagnosed with hypertension in the past.  Medication Adjustments/Labs and Tests Ordered: Current medicines are reviewed at length with the patient today.  Concerns regarding medicines are outlined above.  Orders Placed This Encounter  Procedures  . CT CORONARY MORPH W/CTA COR W/SCORE W/CA W/CM &/OR WO/CM  . CT CORONARY FRACTIONAL FLOW RESERVE DATA PREP  . CT CORONARY FRACTIONAL FLOW RESERVE FLUID ANALYSIS  . EKG 12-Lead   Meds ordered this encounter  Medications  . metoprolol tartrate (LOPRESSOR) 50 MG tablet    Sig: Take 1 tablet (50 mg total) by mouth once for 1 dose. Take one tablet 2 hours before your CT scan    Dispense:  1 tablet    Refill:  0    Patient Instructions  Medication Instructions:  The current medical regimen is effective;  continue present plan and medications.  *If you need a refill on your cardiac medications before your next appointment, please call your pharmacy*  Testing/Procedures: Your physician has requested that you have Coronary CT which  is a painless test that uses an x-ray machine to take clear, detailed pictures of your heart.  Please follow instruction sheet as given.  Follow-Up: At [facility], you and your health needs are our priority.  As part of our continuing mission to provide you with exceptional heart care, we have created designated Provider Care Teams.  These Care  Teams include your primary Cardiologist (physician) and Advanced Practice Providers (APPs -  Physician Assistants and Nurse Practitioners) who all work together to provide you with the care you need, when you need it.  We recommend signing up for the patient portal called "MyChart".  Sign up information is provided on this After Visit Summary.  MyChart is used to connect with patients for Virtual Visits (Telemedicine).  Patients are able to view lab/test results, encounter notes, upcoming appointments, etc.  Non-urgent messages can be sent to your provider as well.   To learn more about what you can do with MyChart, go to [email or web address].    Follow up will be based on the results of the above testing.  Thank you for choosing [facility]!!    Your cardiac CT will be scheduled at:   [facility] [address] [place], [place] [address] [phone]  Please arrive at the [facility] main entrance of [facility] 30 minutes prior to test start time. Proceed to the [facility] Radiology Department (first floor) to check-in and test prep.  Please follow these instructions carefully (unless otherwise directed):  Hold all erectile dysfunction medications at least 3 days (72 hrs) prior to test.  On the Night Before the Test: . Be sure to Drink plenty of water. . Do not consume any caffeinated/decaffeinated beverages or chocolate 12 hours prior to your test. . Do not take any antihistamines 12 hours prior to your test.  On the Day of the Test: . Drink plenty of water. Do not drink any water within one hour of the test. . Do not eat any food 4 hours prior to the test. . You may take your regular medications prior to the test.  . Take metoprolol (Lopressor 50 mg) two hours prior to test. . HOLD Furosemide/Hydrochlorothiazide morning of the test. . FEMALES- please wear underwire-free bra if available   Test: . Drink plenty of water. . After receiving  IV contrast, you  may experience a mild flushed feeling. This is normal. . On occasion, you may experience a mild rash up to 24 hours after the test. This is not dangerous. If this occurs, you can take Benadryl 25 mg and increase your fluid intake. . If you experience trouble breathing, this can be serious. If it is severe call 911 IMMEDIATELY. If it is mild, please call our office.  Once we have confirmed authorization from your insurance company, we will call you to set up a date and time for your test.   For non-scheduling related questions, please contact the cardiac imaging nurse navigator should you have any questions/concerns: Marchia Bond, Cardiac Imaging Nurse Navigator Burley Saver, Interim Cardiac Imaging Nurse [facility] and Vascular Services Direct Office Dial: [phone]   For scheduling needs, including cancellations and rescheduling, please call [phone].       Signed, Candee Furbish, MD  07/21/2019 11:43 AM    [facility]

## 2019-07-28 ENCOUNTER — Encounter: Payer: Self-pay | Admitting: Internal Medicine

## 2019-07-28 ENCOUNTER — Ambulatory Visit: Payer: Medicare Other | Attending: Internal Medicine | Admitting: Internal Medicine

## 2019-07-28 ENCOUNTER — Other Ambulatory Visit: Payer: Self-pay

## 2019-07-28 VITALS — BP 176/91 | HR 62 | Temp 97.9°F | Resp 16 | Wt 206.2 lb

## 2019-07-28 DIAGNOSIS — Z1211 Encounter for screening for malignant neoplasm of colon: Secondary | ICD-10-CM

## 2019-07-28 DIAGNOSIS — K219 Gastro-esophageal reflux disease without esophagitis: Secondary | ICD-10-CM

## 2019-07-28 DIAGNOSIS — Z2821 Immunization not carried out because of patient refusal: Secondary | ICD-10-CM

## 2019-07-28 DIAGNOSIS — E669 Obesity, unspecified: Secondary | ICD-10-CM | POA: Diagnosis not present

## 2019-07-28 DIAGNOSIS — Z1159 Encounter for screening for other viral diseases: Secondary | ICD-10-CM

## 2019-07-28 DIAGNOSIS — R030 Elevated blood-pressure reading, without diagnosis of hypertension: Secondary | ICD-10-CM | POA: Diagnosis not present

## 2019-07-28 DIAGNOSIS — F1290 Cannabis use, unspecified, uncomplicated: Secondary | ICD-10-CM | POA: Diagnosis not present

## 2019-07-28 DIAGNOSIS — Z125 Encounter for screening for malignant neoplasm of prostate: Secondary | ICD-10-CM

## 2019-07-28 MED ORDER — PANTOPRAZOLE SODIUM 40 MG PO TBEC: 40.0000 mg | DELAYED_RELEASE_TABLET | Freq: Every day | ORAL | 3 refills | Status: DC

## 2019-07-28 NOTE — Progress Notes (Addendum)
Patient ID: Casey James, male    DOB: 05-07-1955  MRN: [id number]  CC: Hypertension   Subjective: Casey James is a 64 y.o. male who presents for chronic disease management His concerns today include:  GERD, former smoker, HL  Since last visit with me, he has seen the cardiologist, Dr. Candee Furbish. Coronary CT was ordered. Told to take  Metoprolol once before CT.  -every day in the mornings, he gets burning in his chest after eating breakfast. One episode was worse after eating a sandwich that had tomatoes on it.  Usually occurs if he starts moving around shortly after eating a meal.  He is on a baby aspirin which he takes at nights.  Denies any acid reflux symptoms when he takes the baby aspirin.  Symptoms occur only during the day. -takes Omeprazole 20 mg BID.   BMI: He asked about what his BMI and wanted to know what his BMI always.  He reports that he has been fairly active over the past several weeks as he is remodeling a house.  He feels he has lost some weight as his belt size has decreased by 1-2 notches.    Blood pressure elevated today.  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  Never placed on medication because the blood pressure would come down with relaxation.  He admits that he loves salty foods.  Smokes marijuana 1/2 jt at nights to help with sleep.    HM:  Plans to get COVID vaccine.  Declines Prevnar 13. Never had c-scope but did have neg cologurad in past.   Patient Active Problem List   Diagnosis Date Noted  . Chest pain in adult 06/23/2019  . Gastroesophageal reflux disease without esophagitis 06/23/2019  . Former smoker 06/23/2019  . Ptosis of eyelid, right 07/13/2011     Current Outpatient Medications on File Prior to Visit  Medication Sig Dispense Refill  . aspirin EC 81 MG tablet Take 81 mg by mouth daily.    . metoprolol tartrate (LOPRESSOR) 50  MG tablet Take 1 tablet (50 mg total) by mouth once for 1 dose. Take one tablet 2 hours before your CT scan 1 tablet 0  . Multiple Vitamins-Minerals (MULTIVITAMIN WITH IRON-MINERALS) liquid Take by mouth daily.    . tadalafil (CIALIS) 20 MG tablet Take 1 tablet (20 mg total) by mouth daily as needed for erectile dysfunction. 5 tablet 11   No current facility-administered medications on file prior to visit.    No Known Allergies  Social History   Socioeconomic History  . Marital status: Married    Spouse name: Not on file  . Number of children: Not on file  . Years of education: Not on file  . Highest education level: Not on file  Occupational History  . Not on file  Tobacco Use  . Smoking status: Former Smoker    Years: 50.00    Quit date: 10/21/2018    Years since quitting: 0.7  . Smokeless tobacco: Never Used  Substance and Sexual Activity  . Alcohol use: Not on file  . Drug use: Not on file  . Sexual activity: Not on file  Other Topics Concern  . Not on file  Social History Narrative  . Not on file   Social Determinants of Health   Financial Resource Strain:   . Difficulty of Paying Living Expenses:  Food Insecurity:   . Worried About Charity fundraiser in the Last Year:   . Arboriculturist in the Last Year:   Transportation Needs:   . Film/video editor (Medical):   Marland Kitchen Lack of Transportation (Non-Medical):   Physical Activity:   . Days of Exercise per Week:   . Minutes of Exercise per Session:   Stress:   . Feeling of Stress :   Social Connections:   . Frequency of Communication with Friends and Family:   . Frequency of Social Gatherings with Friends and Family:   . Attends Religious Services:   . Active Member of Clubs or Organizations:   . Attends Archivist Meetings:   Marland Kitchen Marital Status:   Intimate Partner Violence:   . Fear of Current or Ex-Partner:   . Emotionally Abused:   Marland Kitchen Physically Abused:   . Sexually Abused:     Family  History  Problem Relation Age of Onset  . Cancer Mother   . Pulmonary fibrosis Mother   . Diabetes Brother   . Heart disease Father     Past Surgical History:  Procedure Laterality Date  . CHOLECYSTECTOMY    . FRACTURE SURGERY    . pulmonary fibrosis      ROS: Review of Systems Negative except as stated above  PHYSICAL EXAM: BP (!) 176/91   Pulse 62   Temp 97.9 F (36.6 C)   Resp 16   Wt 206 lb 3.2 oz (93.5 kg)   SpO2 96%   BMI 30.45 kg/m    BP 174/80 RT, 170/80  Physical Exam  General appearance - alert, well appearing, older Caucasian male and in no distress Mental status - normal mood, behavior, speech, dress, motor activity, and thought processes Eyes - pupils equal and reactive, extraocular eye movements intact Nose - normal and patent, no erythema, discharge or polyps Mouth -he is to remaining teeth in the upper jaw on 6 in the lower jaw Chest - clear to auscultation, no wheezes, rales or rhonchi, symmetric air entry Heart - normal rate, regular rhythm, normal S1, S2, no murmurs, rubs, clicks or gallops Extremities - peripheral pulses normal, no pedal edema, no clubbing or cyanosis  CMP Latest Ref Rng & Units 07/16/2012 07/13/2011 08/09/2007  Glucose 70 - 99 mg/dL 98 85 122(H)  BUN 6 - 23 mg/dL 8 8 11   Creatinine 0.50 - 1.35 mg/dL 0.87 0.95 1.4  Sodium 135 - 145 mEq/L 140 139 137  Potassium 3.5 - 5.3 mEq/L 4.0 4.3 3.9  Chloride 96 - 112 mEq/L 107 104 104  CO2 19 - 32 mEq/L 22 24 -  Calcium 8.4 - 10.5 mg/dL 9.2 9.1 -  Total Protein 6.0 - 8.3 g/dL 7.0 7.6 -  Total Bilirubin 0.3 - 1.2 mg/dL 0.9 0.7 -  Alkaline Phos 39 - 117 U/L 70 104 -  AST 0 - 37 U/L 20 23 -  ALT 0 - 53 U/L 16 16 -   Lipid Panel     Component Value Date/Time   CHOL 207 (H) 07/16/2012 1359   TRIG 178 (H) 07/16/2012 1359   HDL 36 (L) 07/16/2012 1359   CHOLHDL 5.8 07/16/2012 1359   VLDL 36 07/16/2012 1359   LDLCALC 135 (H) 07/16/2012 1359    CBC    Component Value Date/Time   WBC  11.1 (H) 07/16/2012 1359   RBC 5.12 07/16/2012 1359   HGB 15.7 07/16/2012 1359   HCT 45.3 07/16/2012 1359  PLT 194 07/16/2012 1359   MCV 88.5 07/16/2012 1359   MCV 92.3 07/13/2011 1419   MCH 30.7 07/16/2012 1359   MCHC 34.7 07/16/2012 1359   RDW 14.6 07/16/2012 1359   LYMPHSABS 2.4 07/16/2012 1359   MONOABS 0.6 07/16/2012 1359   EOSABS 0.1 07/16/2012 1359   BASOSABS 0.0 07/16/2012 1359    ASSESSMENT AND PLAN: 1. Gastroesophageal reflux disease without esophagitis Patient still with significant reflux symptoms.  Advised the foods to avoid.  We will try changing omeprazole to Protonix to see if this works better for him. - CBC - pantoprazole (PROTONIX) 40 MG tablet; Take 1 tablet (40 mg total) by mouth daily.  Dispense: 30 tablet; Refill: 3  2. Elevated blood pressure reading without diagnosis of hypertension DASH diet discussed and encouraged. Blood pressure was elevated when he saw the cardiologist recently and is elevated today.  We discussed starting him on medication today versus having him return in 2 weeks to see our clinical pharmacist.  He will cut back on salty foods to see if this makes a difference.  If blood pressure still elevated on that visit I recommend starting him on amlodipine. - CBC  3. Obesity (BMI 30-39.9) Dietary counseling given - CBC - Comprehensive metabolic panel - Lipid panel  4. Marijuana user   5. Need for hepatitis C screening test Patient agreeable to hepatitis C screening - Hepatitis C Antibody  6. Screening for colon cancer Patient does not want colonoscopy but is agreeable to having a Cologuard - Cologuard  7. 23-polyvalent pneumococcal polysaccharide vaccine declined This was offered.  Patient declined  8. Prostate cancer screening - PSA   Patient was given the opportunity to ask questions.  Patient verbalized understanding of the plan and was able to repeat key elements of the plan.   Addendum: Patient's lipid profile is  abnormal.  Results sent to him via MyChart.  I recommend starting atorvastatin. Lab Results  Component Value Date   CHOL 249 (H) 07/28/2019   HDL 36 (L) 07/28/2019   LDLCALC 187 (H) 07/28/2019   TRIG 139 07/28/2019   CHOLHDL 6.9 (H) 07/28/2019    The 10-year ASCVD risk score Mikey Bussing DC Jr., et al., 2013) is: 29%   Values used to calculate the score:     Age: 84 years     Sex: Male     Is Non-Hispanic African American: No     Diabetic: No     Tobacco smoker: No     Systolic Blood Pressure: 0000000 mmHg     Is BP treated: No     HDL Cholesterol: 36 mg/dL     Total Cholesterol: 249 mg/dL  Orders Placed This Encounter  Procedures  . CBC  . Comprehensive metabolic panel  . Lipid panel  . Hepatitis C Antibody  . PSA  . Cologuard     Requested Prescriptions   Signed Prescriptions Disp Refills  . pantoprazole (PROTONIX) 40 MG tablet 30 tablet 3    Sig: Take 1 tablet (40 mg total) by mouth daily.    Return in about 6 weeks (around 09/08/2019) for Give appt with [facility] in 1 wk for BP recheck. Casey Plumber, MD, FACP

## 2019-07-28 NOTE — Patient Instructions (Signed)

## 2019-07-29 ENCOUNTER — Other Ambulatory Visit: Payer: Self-pay | Admitting: Internal Medicine

## 2019-07-29 ENCOUNTER — Telehealth: Payer: Self-pay

## 2019-07-29 LAB — COMPREHENSIVE METABOLIC PANEL
ALT: 18 IU/L (ref 0–44)
AST: 17 IU/L (ref 0–40)
Albumin/Globulin Ratio: 1.5 (ref 1.2–2.2)
Albumin: 4.5 g/dL (ref 3.8–4.8)
Alkaline Phosphatase: 98 IU/L (ref 48–121)
BUN/Creatinine Ratio: 10 (ref 10–24)
BUN: 11 mg/dL (ref 8–27)
Bilirubin Total: 0.7 mg/dL (ref 0.0–1.2)
CO2: 23 mmol/L (ref 20–29)
Calcium: 9.4 mg/dL (ref 8.6–10.2)
Chloride: 100 mmol/L (ref 96–106)
Creatinine, Ser: 1.08 mg/dL (ref 0.76–1.27)
GFR calc Af Amer: 83 mL/min/{1.73_m2} (ref >59)
GFR calc non Af Amer: 72 mL/min/{1.73_m2} (ref >59)
Globulin, Total: 3.1 g/dL (ref 1.5–4.5)
Glucose: 86 mg/dL (ref 65–99)
Potassium: 4.9 mmol/L (ref 3.5–5.2)
Sodium: 138 mmol/L (ref 134–144)
Total Protein: 7.6 g/dL (ref 6.0–8.5)

## 2019-07-29 LAB — CBC
Hematocrit: 46.2 % (ref 37.5–51.0)
Hemoglobin: 16 g/dL (ref 13.0–17.7)
MCH: 30.6 pg (ref 26.6–33.0)
MCHC: 34.6 g/dL (ref 31.5–35.7)
MCV: 88 fL (ref 79–97)
Platelets: 216 10*3/uL (ref 150–450)
RBC: 5.23 x10E6/uL (ref 4.14–5.80)
RDW: 13.4 % (ref 11.6–15.4)
WBC: 9.9 10*3/uL (ref 3.4–10.8)

## 2019-07-29 LAB — LIPID PANEL
Chol/HDL Ratio: 6.9 ratio — ABNORMAL HIGH (ref 0.0–5.0)
Cholesterol, Total: 249 mg/dL — ABNORMAL HIGH (ref 100–199)
HDL: 36 mg/dL — ABNORMAL LOW (ref >39)
LDL Chol Calc (NIH): 187 mg/dL — ABNORMAL HIGH (ref 0–99)
Triglycerides: 139 mg/dL (ref 0–149)
VLDL Cholesterol Cal: 26 mg/dL (ref 5–40)

## 2019-07-29 LAB — PSA: Prostate Specific Ag, Serum: 1.8 ng/mL (ref 0.0–4.0)

## 2019-07-29 LAB — HEPATITIS C ANTIBODY: Hep C Virus Ab: <0.1 s/co ratio (ref 0.0–0.9)

## 2019-07-29 MED ORDER — ATORVASTATIN CALCIUM 10 MG PO TABS
10.0000 mg | ORAL_TABLET | Freq: Every day | ORAL | 6 refills | Status: DC
Start: 2019-07-29 — End: 2019-11-17

## 2019-07-29 NOTE — Telephone Encounter (Addendum)
-----   Message from Ladell Pier, MD sent at 07/29/2019 10:23 AM EDT ----- Your blood count is normal meaning no anemia.Your kidney and liver function tests are good.Your cholesterol levels are abnormal. Your total cholesterol is 249 with goal being less than 200. Your LDL cholesterol is 187 with goal being less than 100. This puts you at increased risk for heart attack and strokes. I recommend starting a medication call Lipitor to help lower your cholesterol. I have sent this prescription to your pharmacy. Screen for hepatitis C is negative. PSA which is the screening test for prostate cancer is normal.

## 2019-07-29 NOTE — Telephone Encounter (Signed)
Contacted pt to go over lab results pt didn't answer left a detailed vm informing pt of results and if he has any questions or concerns to give a call  °

## 2019-08-04 ENCOUNTER — Encounter: Payer: Self-pay | Admitting: Pharmacist

## 2019-08-04 ENCOUNTER — Ambulatory Visit: Payer: Medicare Other | Attending: Internal Medicine | Admitting: Pharmacist

## 2019-08-04 ENCOUNTER — Other Ambulatory Visit: Payer: Self-pay

## 2019-08-04 VITALS — BP 146/83 | HR 64

## 2019-08-04 DIAGNOSIS — I10 Essential (primary) hypertension: Secondary | ICD-10-CM

## 2019-08-04 MED ORDER — AMLODIPINE BESYLATE 5 MG PO TABS: 5.0000 mg | ORAL_TABLET | Freq: Every day | ORAL | 0 refills | Status: DC

## 2019-08-04 NOTE — Progress Notes (Signed)
   S:    PCP: Dr. Wynetta Emery  Patient arrives in good spirits. Presents to the clinic for hypertension evaluation, counseling, and management. Patient was referred and last seen by Primary Care Provider on 07/28/2019.  BP was elevated at that appointment. Pt was instructed to return to clinic today and Dr. Wynetta Emery recommends to start amlodipine if BP is elevated today.  Medication adherence:  Does not take antihypertensive medications.  Current BP Medications include:  None   Dietary habits include: pt reports limiting salt; drinks no more than 3 cups of coffee/day Exercise habits include: limited by GERD symptoms  Family / Social history:  -  Fhx: diabetes, heart disease, pulmonary fibrosis  - Tobacco: former smoker  - Alcohol: denies current use  O:  Vitals:   08/04/19 1446  BP: (!) 146/83  Pulse: 64    Home BP readings: none; plans to obtain cuff  Last 3 Office BP readings: BP Readings from Last 3 Encounters:  08/04/19 (!) 146/83  07/28/19 (!) 176/91  07/21/19 140/80   BMET    Component Value Date/Time   NA 138 07/28/2019 1459   K 4.9 07/28/2019 1459   CL 100 07/28/2019 1459   CO2 23 07/28/2019 1459   GLUCOSE 86 07/28/2019 1459   GLUCOSE 98 07/16/2012 1359   BUN 11 07/28/2019 1459   CREATININE 1.08 07/28/2019 1459   CREATININE 0.87 07/16/2012 1359   CALCIUM 9.4 07/28/2019 1459   GFRNONAA 72 07/28/2019 1459   GFRAA 83 07/28/2019 1459    Renal function: Estimated Creatinine Clearance: 77 mL/min (by C-G formula based on SCr of 1.08 mg/dL).  Clinical ASCVD: No  The 10-year ASCVD risk score Mikey Bussing DC Jr., et al., 2013) is: 21.8%   Values used to calculate the score:     Age: 37 years     Sex: Male     Is Non-Hispanic African American: No     Diabetic: No     Tobacco smoker: No     Systolic Blood Pressure: 387 mmHg     Is BP treated: No     HDL Cholesterol: 36 mg/dL     Total Cholesterol: 249 mg/dL   A/P: Hypertension newly diagnosed currently above goal, not  on medications. BP Goal = < 130/80 mmHg. Will start amlodipine 5 mg daily.   -Started amlodipine 5 mg daily.  -Counseled on lifestyle modifications for blood pressure control including reduced dietary sodium, increased exercise, adequate sleep.  Results reviewed and written information provided. Total time in face-to-face counseling 15 minutes.   F/U Clinic Visit in 1 month with PCP.  Benard Halsted, PharmD, [place] [phone]

## 2019-08-11 DIAGNOSIS — Z1211 Encounter for screening for malignant neoplasm of colon: Secondary | ICD-10-CM | POA: Diagnosis not present

## 2019-08-12 LAB — COLOGUARD: Cologuard: NEGATIVE

## 2019-08-17 ENCOUNTER — Telehealth (HOSPITAL_COMMUNITY): Payer: Self-pay | Admitting: *Deleted

## 2019-08-17 NOTE — Telephone Encounter (Signed)
Attempted to call patient regarding upcoming cardiac CT appointment. °Left message on voicemail with name and callback number ° °Merle Tai RN Navigator Cardiac Imaging °[facility] °[phone] Office °[phone] Cell ° °

## 2019-08-17 NOTE — Telephone Encounter (Signed)
Pt returning call regarding upcoming cardiac imaging study; pt verbalizes understanding of appt date/time, parking situation and where to check in, pre-test NPO status and medications ordered, and verified current allergies; name and call back number provided for further questions should they arise  Sophiagrace Benbrook Tai RN Navigator Cardiac Imaging [facility] [phone] office [phone] cell  

## 2019-08-18 ENCOUNTER — Ambulatory Visit (HOSPITAL_COMMUNITY)
Admission: RE | Admit: 2019-08-18 | Discharge: 2019-08-18 | Disposition: A | Discharge status: Home / Self Care | Payer: Medicare Other | Source: Ambulatory Visit | Attending: Cardiology | Admitting: Cardiology

## 2019-08-18 DIAGNOSIS — I209 Angina pectoris, unspecified: Secondary | ICD-10-CM

## 2019-08-18 DIAGNOSIS — Z87891 Personal history of nicotine dependence: Secondary | ICD-10-CM

## 2019-08-18 DIAGNOSIS — R079 Chest pain, unspecified: Secondary | ICD-10-CM | POA: Diagnosis not present

## 2019-08-18 MED ORDER — NITROGLYCERIN 0.4 MG SL SUBL
0.8000 mg | SUBLINGUAL_TABLET | Freq: Once | SUBLINGUAL | Status: AC
  Administered 2019-08-18: 0.8 mg via SUBLINGUAL

## 2019-08-18 MED ORDER — IOHEXOL 300 MG/ML  SOLN
100.0000 mL | Freq: Once | INTRAMUSCULAR | Status: AC | PRN
  Administered 2019-08-18: 100 mL via INTRAVENOUS

## 2019-08-18 MED ORDER — NITROGLYCERIN 0.4 MG SL SUBL
SUBLINGUAL_TABLET | SUBLINGUAL | Status: AC
  Filled 2019-08-18: qty 2

## 2019-08-19 ENCOUNTER — Other Ambulatory Visit: Payer: Self-pay

## 2019-08-19 ENCOUNTER — Telehealth: Payer: Self-pay | Admitting: Internal Medicine

## 2019-08-19 NOTE — Telephone Encounter (Signed)
Contacted pt and left a detailed vm informing pt of results and if he has any questions or concerns to give a call  

## 2019-08-20 ENCOUNTER — Telehealth: Payer: Self-pay | Admitting: Cardiology

## 2019-08-20 NOTE — Telephone Encounter (Signed)
New message  Pt is calling for results from his CT   Please call

## 2019-08-20 NOTE — Telephone Encounter (Signed)
Left message to call office

## 2019-08-20 NOTE — Telephone Encounter (Signed)
Josue Hector, MD  Thompson Grayer, RN; Jerline Pain, MD CT shows occluded RCA with collaterals dont think there is obstructive left sided disease can be set up for elective cath not critical/urgent can see PA/Skains tomorrow or early next week to arrange

## 2019-08-20 NOTE — Telephone Encounter (Signed)
Dr Johnsie Cancel has spoken with patient and reviewed CT results with him. I called patient and offered him appointment with Dr Tamala Julian next week as Dr Marlou Porch is not in the office.  Patient would like to wait to see Dr Marlou Porch.  Appointment made for patient to see Dr Marlou Porch on July 12,2021 at 11:40.

## 2019-08-21 ENCOUNTER — Telehealth: Payer: Self-pay

## 2019-08-21 MED ORDER — ROSUVASTATIN CALCIUM 20 MG PO TABS
20.0000 mg | ORAL_TABLET | Freq: Every day | ORAL | 3 refills | Status: DC
Start: 2019-08-21 — End: 2019-08-21

## 2019-08-21 NOTE — Telephone Encounter (Signed)
Prescription sent.  mychart message sent to Pt advising of new prescription.

## 2019-08-21 NOTE — Telephone Encounter (Signed)
-----   Message from Jerline Pain, MD sent at 08/21/2019  8:50 AM EDT ----- Abnormal coronary CT with apparent occlusion of the RCA.  Please have him come in to see APP to get set up for heart catheterization.   Also start Crestor 20mg  PO QD. Repeat lipids in 3 months with ALT.   Candee Furbish MD

## 2019-08-21 NOTE — Telephone Encounter (Signed)
Call back received from Pt.  Pt advised he was already on atorvastatin.  Advised to continue atorvastatin and will dc order for rosuvastatin.  No further action needed.

## 2019-08-21 NOTE — Addendum Note (Signed)
Addended by: Willeen Cass A on: 08/21/2019 10:13 AM   Modules accepted: Orders

## 2019-09-07 ENCOUNTER — Other Ambulatory Visit: Payer: Self-pay

## 2019-09-07 ENCOUNTER — Ambulatory Visit (INDEPENDENT_AMBULATORY_CARE_PROVIDER_SITE_OTHER): Payer: Medicare Other | Admitting: Cardiology

## 2019-09-07 ENCOUNTER — Encounter: Payer: Self-pay | Admitting: Cardiology

## 2019-09-07 VITALS — BP 110/60 | HR 71 | Ht 69.0 in | Wt 205.0 lb

## 2019-09-07 DIAGNOSIS — I209 Angina pectoris, unspecified: Secondary | ICD-10-CM

## 2019-09-07 DIAGNOSIS — I2510 Atherosclerotic heart disease of native coronary artery without angina pectoris: Secondary | ICD-10-CM | POA: Diagnosis not present

## 2019-09-07 DIAGNOSIS — Z87891 Personal history of nicotine dependence: Secondary | ICD-10-CM | POA: Diagnosis not present

## 2019-09-07 NOTE — Progress Notes (Signed)
Cardiology Office Note:    Date:  09/07/2019   ID:  Casey James, DOB 02-17-1956, MRN [id number]  PCP:  Casey Pier, MD  [facility] Cardiologist:  Casey Furbish, MD  [facility] Electrophysiologist:  None   Referring MD: Casey Pier, MD     History of Present Illness:    Casey James is a 64 y.o. male here for follow-up discussion cardiac CT: 1.  Normal coronary origin with right dominance.  2. The RCA is completely occluded with evidence of collateral filling of the distal RCA and PDA from the LAD.  3.  Cannot rule out moderate proximal LCX stenosis.  4.  Recommend cardiac catheterization.  Casey Latch, MD  Prior office visit: was feeling some burning sensation in chest that seem to be progressively getting worse over the past year, perhaps worse when bending over or lifting or walking with occasional radiation to left arm.  When resting, seems to improve.  Worse in the morning. If active needs to sit down. Painting house. Might start up again. Rest of day.   Sometimes will notice this after eating.  In fact, reduced the frequency of eating down to about 1 meal of day because of it.  If tomato based foods such as pizza or spaghetti or eaten, this seems to exacerbate it.  He has stopped eating these types of foods.  Tried PPI as well as H2 blocker.  May have helped a little bit but it returned after stopping the medication.  Was a smoker, quit in August 2020.  Also quit alcohol about 4 months ago.  Has a family history of coronary artery disease, father in his 16s had stents placed.  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   Today we discussed his CT results as above.  He is willing to proceed with heart catheterization.  Past Medical History:  Diagnosis Date   Chest pain    Emphysema of lung ([place])    Former smoker    GERD (gastroesophageal reflux disease)     Past  Surgical History:  Procedure Laterality Date   CHOLECYSTECTOMY     FRACTURE SURGERY     pulmonary fibrosis      Current Medications: Current Meds  Medication Sig   amLODipine (NORVASC) 5 MG tablet Take 1 tablet (5 mg total) by mouth daily.   aspirin EC 81 MG tablet Take 81 mg by mouth daily.   atorvastatin (LIPITOR) 10 MG tablet Take 1 tablet (10 mg total) by mouth daily.   Multiple Vitamins-Minerals (MULTIVITAMIN WITH IRON-MINERALS) liquid Take by mouth daily.   pantoprazole (PROTONIX) 40 MG tablet Take 1 tablet (40 mg total) by mouth daily.     Allergies:   Patient has no known allergies.   Social History   Socioeconomic History   Marital status: Married    Spouse name: Not on file   Number of children: Not on file   Years of education: Not on file   Highest education level: Not on file  Occupational History   Not on file  Tobacco Use   Smoking status: Former Smoker    Years: 50.00    Quit date: 10/21/2018    Years since quitting: 0.8   Smokeless tobacco: Never Used  Substance and Sexual Activity   Alcohol use: Not on file   Drug use: Not on file   Sexual activity: Not on file  Other Topics Concern  Not on file  Social History Narrative   Not on file   Social Determinants of Health   Financial Resource Strain:    Difficulty of Paying Living Expenses:   Food Insecurity:    Worried About Charity fundraiser in the Last Year:    Arboriculturist in the Last Year:   Transportation Needs:    Film/video editor (Medical):    Lack of Transportation (Non-Medical):   Physical Activity:    Days of Exercise per Week:    Minutes of Exercise per Session:   Stress:    Feeling of Stress :   Social Connections:    Frequency of Communication with Friends and Family:    Frequency of Social Gatherings with Friends and Family:    Attends Religious Services:    Active Member of Clubs or Organizations:    Attends Arts administrator:    Marital Status:      Family History: The patient's family history includes Cancer in his mother; Diabetes in his brother; Heart disease in his father; Pulmonary fibrosis in his mother.  ROS:   Please see the history of present illness.     All other systems reviewed and are negative.  EKGs/Labs/Other Studies Reviewed:     EKG:  Prior sinus bradycardia 59 nonspecific ST-T wave flattening in May.  Recent Labs: 07/28/2019: ALT 18; BUN 11; Creatinine, Ser 1.08; Hemoglobin 16.0; Platelets 216; Potassium 4.9; Sodium 138  Recent Lipid Panel    Component Value Date/Time   CHOL 249 (H) 07/28/2019 1459   TRIG 139 07/28/2019 1459   HDL 36 (L) 07/28/2019 1459   CHOLHDL 6.9 (H) 07/28/2019 1459   CHOLHDL 5.8 07/16/2012 1359   VLDL 36 07/16/2012 1359   LDLCALC 187 (H) 07/28/2019 1459    Physical Exam:    VS:  BP 110/60    Pulse 71    Ht 5\' 9"  (1.753 m)    Wt 205 lb (93 kg)    SpO2 96%    BMI 30.27 kg/m     Wt Readings from Last 3 Encounters:  09/07/19 205 lb (93 kg)  07/28/19 206 lb 3.2 oz (93.5 kg)  07/21/19 206 lb (93.4 kg)     GEN:  Well nourished, well developed in no acute distress HEENT: Normal NECK: No JVD; No carotid bruits LYMPHATICS: No lymphadenopathy CARDIAC: RRR, no murmurs, rubs, gallops RESPIRATORY:  Clear to auscultation without rales, wheezing or rhonchi  ABDOMEN: Soft, non-tender, non-distended MUSCULOSKELETAL:  No edema; No deformity  SKIN: Warm and dry NEUROLOGIC:  Alert and oriented x 3 PSYCHIATRIC:  Normal affect   ASSESSMENT:    1. Angina pectoris ([place])   2. Former smoker   3. Coronary artery disease involving native coronary artery of native heart without angina pectoris    PLAN:    In order of problems listed above:  CAD/angina/abnormal coronary CT scan -Appears to have occluded right coronary artery with left to right collaterals and potential moderate circumflex disease.  We will go ahead and proceed with cardiac catheterization.   Risks and benefits have been explained including stroke heart attack death renal impairment bleeding.  He is willing to proceed.  Radial artery approach. -After catheterization, continue to optimize/intensify medical management.  He admits that he does not like to take the medications.  Essential hypertension -Currently very well controlled with amlodipine 5 mg once a day.  He has been watching his diet, salt.  Try to eat more fruit.  Hyperlipidemia -Agree with atorvastatin.  We will eventually increase his dose to a high intensity statin dose.  Once again he is a little hesitant now to change his medications.  Tobacco cessation -Has continued with this since August 20 20.  Excellent.  We will have him follow-up in about 1 month with APP.   Medication Adjustments/Labs and Tests Ordered: Current medicines are reviewed at length with the patient today.  Concerns regarding medicines are outlined above.  No orders of the defined types were placed in this encounter.  No orders of the defined types were placed in this encounter.   Patient Instructions  Medication Instructions:  The current medical regimen is effective;  continue present plan and medications.  *If you need a refill on your cardiac medications before your next appointment, please call your pharmacy*  Lab Work: You will need blood work and an EKG before your cardiac cath. (BMP, CBC) If you have labs (blood work) drawn today and your tests are completely normal, you will receive your results only by:  [facility] (if you have MyChart) OR  A paper copy in the mail If you have any lab test that is abnormal or we need to change your treatment, we will call you to review the results.   Testing/Procedures: Your physician has requested that you have a cardiac catheterization. Cardiac catheterization is used to diagnose and/or treat various heart conditions. Doctors may recommend this procedure for a number of different  reasons. The most common reason is to evaluate chest pain. Chest pain can be a symptom of coronary artery disease (CAD), and cardiac catheterization can show whether plaque is narrowing or blocking your hearts arteries. This procedure is also used to evaluate the valves, as well as measure the blood flow and oxygen levels in different parts of your heart.   Follow-Up: At [facility], you and your health needs are our priority.  As part of our continuing mission to provide you with exceptional heart care, we have created designated Provider Care Teams.  These Care Teams include your primary Cardiologist (physician) and Advanced Practice Providers (APPs -  Physician Assistants and Nurse Practitioners) who all work together to provide you with the care you need, when you need it.  We recommend signing up for the patient portal called "MyChart".  Sign up information is provided on this After Visit Summary.  MyChart is used to connect with patients for Virtual Visits (Telemedicine).  Patients are able to view lab/test results, encounter notes, upcoming appointments, etc.  Non-urgent messages can be sent to your provider as well.   To learn more about what you can do with MyChart, go to [email or web address].    Your next appointment:   1 month(s)  The format for your next appointment:   In Person  Provider:   You may see one of the following Advanced Practice Providers on your designated Care Team:    Truitt Merle, NP  Cecilie Kicks, NP  Kathyrn Drown, NP    Thank you for choosing [facility]!!     [email or web address]    Procedure: Left cardiac cath CPT: 73710  Please call when you are ready to schedule!!  [phone]    Signed, Casey Furbish, MD  09/07/2019 12:23 PM    [place]

## 2019-09-07 NOTE — Patient Instructions (Addendum)
Medication Instructions:  The current medical regimen is effective;  continue present plan and medications.  *If you need a refill on your cardiac medications before your next appointment, please call your pharmacy*  Lab Work: You will need blood work and an EKG before your cardiac cath. (BMP, CBC) If you have labs (blood work) drawn today and your tests are completely normal, you will receive your results only by: Marland Kitchen MyChart Message (if you have MyChart) OR . A paper copy in the mail If you have any lab test that is abnormal or we need to change your treatment, we will call you to review the results.   Testing/Procedures: Your physician has requested that you have a cardiac catheterization. Cardiac catheterization is used to diagnose and/or treat various heart conditions. Doctors may recommend this procedure for a number of different reasons. The most common reason is to evaluate chest pain. Chest pain can be a symptom of coronary artery disease (CAD), and cardiac catheterization can show whether plaque is narrowing or blocking your heart's arteries. This procedure is also used to evaluate the valves, as well as measure the blood flow and oxygen levels in different parts of your heart.   Follow-Up: At [facility], you and your health needs are our priority.  As part of our continuing mission to provide you with exceptional heart care, we have created designated Provider Care Teams.  These Care Teams include your primary Cardiologist (physician) and Advanced Practice Providers (APPs -  Physician Assistants and Nurse Practitioners) who all work together to provide you with the care you need, when you need it.  We recommend signing up for the patient portal called "MyChart".  Sign up information is provided on this After Visit Summary.  MyChart is used to connect with patients for Virtual Visits (Telemedicine).  Patients are able to view lab/test results, encounter notes, upcoming appointments,  etc.  Non-urgent messages can be sent to your provider as well.   To learn more about what you can do with MyChart, go to [email or web address].    Your next appointment:   1 month(s)  The format for your next appointment:   In Person  Provider:   You may see one of the following Advanced Practice Providers on your designated Care Team:    Truitt Merle, NP  Cecilie Kicks, NP  Kathyrn Drown, NP    Thank you for choosing [facility]!!     [email or web address]    Procedure: Left cardiac cath CPT: 50539  Please call when you are ready to schedule!!  [phone]

## 2019-09-07 NOTE — H&P (View-Only) (Signed)
Cardiology Office Note:    Date:  09/07/2019   ID:  [name], DOB 09/25/1955, MRN [id number]  PCP:  Ladell Pier, MD  [facility] Cardiologist:  Candee Furbish, MD  [facility] Electrophysiologist:  None   Referring MD: Ladell Pier, MD     History of Present Illness:    Casey James is a 64 y.o. male here for follow-up discussion cardiac CT: 1.  Normal coronary origin with right dominance.  2. The RCA is completely occluded with evidence of collateral filling of the distal RCA and PDA from the LAD.  3.  Cannot rule out moderate proximal LCX stenosis.  4.  Recommend cardiac catheterization.  Casey Latch, MD  Prior office visit: was feeling some burning sensation in chest that seem to be progressively getting worse over the past year, perhaps worse when bending over or lifting or walking with occasional radiation to left arm.  When resting, seems to improve.  Worse in the morning. If active needs to sit down. Painting house. Might start up again. Rest of day.   Sometimes will notice this after eating.  In fact, reduced the frequency of eating down to about 1 meal of day because of it.  If tomato based foods such as pizza or spaghetti or eaten, this seems to exacerbate it.  He has stopped eating these types of foods.  Tried PPI as well as H2 blocker.  May have helped a little bit but it returned after stopping the medication.  Was a smoker, quit in August 2020.  Also quit alcohol about 4 months ago.  Has a family history of coronary artery disease, father in his 63s had stents placed.  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   Today we discussed his CT results as above.  He is willing to proceed with heart catheterization.  Past Medical History:  Diagnosis Date  . Chest pain   . Emphysema of lung ([place])   . Former smoker   . GERD (gastroesophageal reflux disease)     Past  Surgical History:  Procedure Laterality Date  . CHOLECYSTECTOMY    . FRACTURE SURGERY    . pulmonary fibrosis      Current Medications: Current Meds  Medication Sig  . amLODipine (NORVASC) 5 MG tablet Take 1 tablet (5 mg total) by mouth daily.  Marland Kitchen aspirin EC 81 MG tablet Take 81 mg by mouth daily.  Marland Kitchen atorvastatin (LIPITOR) 10 MG tablet Take 1 tablet (10 mg total) by mouth daily.  . Multiple Vitamins-Minerals (MULTIVITAMIN WITH IRON-MINERALS) liquid Take by mouth daily.  . pantoprazole (PROTONIX) 40 MG tablet Take 1 tablet (40 mg total) by mouth daily.     Allergies:   Patient has no known allergies.   Social History   Socioeconomic History  . Marital status: Married    Spouse name: Not on file  . Number of children: Not on file  . Years of education: Not on file  . Highest education level: Not on file  Occupational History  . Not on file  Tobacco Use  . Smoking status: Former Smoker    Years: 50.00    Quit date: 10/21/2018    Years since quitting: 0.8  . Smokeless tobacco: Never Used  Substance and Sexual Activity  . Alcohol use: Not on file  . Drug use: Not on file  . Sexual activity: Not on file  Other Topics Concern  .  Not on file  Social History Narrative  . Not on file   Social Determinants of Health   Financial Resource Strain:   . Difficulty of Paying Living Expenses:   Food Insecurity:   . Worried About Charity fundraiser in the Last Year:   . Arboriculturist in the Last Year:   Transportation Needs:   . Film/video editor (Medical):   Marland Kitchen Lack of Transportation (Non-Medical):   Physical Activity:   . Days of Exercise per Week:   . Minutes of Exercise per Session:   Stress:   . Feeling of Stress :   Social Connections:   . Frequency of Communication with Friends and Family:   . Frequency of Social Gatherings with Friends and Family:   . Attends Religious Services:   . Active Member of Clubs or Organizations:   . Attends Archivist  Meetings:   Marland Kitchen Marital Status:      Family History: The patient's family history includes Cancer in his mother; Diabetes in his brother; Heart disease in his father; Pulmonary fibrosis in his mother.  ROS:   Please see the history of present illness.     All other systems reviewed and are negative.  EKGs/Labs/Other Studies Reviewed:     EKG:  Prior sinus bradycardia 59 nonspecific ST-T wave flattening in May.  Recent Labs: 07/28/2019: ALT 18; BUN 11; Creatinine, Ser 1.08; Hemoglobin 16.0; Platelets 216; Potassium 4.9; Sodium 138  Recent Lipid Panel    Component Value Date/Time   CHOL 249 (H) 07/28/2019 1459   TRIG 139 07/28/2019 1459   HDL 36 (L) 07/28/2019 1459   CHOLHDL 6.9 (H) 07/28/2019 1459   CHOLHDL 5.8 07/16/2012 1359   VLDL 36 07/16/2012 1359   LDLCALC 187 (H) 07/28/2019 1459    Physical Exam:    VS:  BP 110/60   Pulse 71   Ht 5\' 9"  (1.753 m)   Wt 205 lb (93 kg)   SpO2 96%   BMI 30.27 kg/m     Wt Readings from Last 3 Encounters:  09/07/19 205 lb (93 kg)  07/28/19 206 lb 3.2 oz (93.5 kg)  07/21/19 206 lb (93.4 kg)     GEN:  Well nourished, well developed in no acute distress HEENT: Normal NECK: No JVD; No carotid bruits LYMPHATICS: No lymphadenopathy CARDIAC: RRR, no murmurs, rubs, gallops RESPIRATORY:  Clear to auscultation without rales, wheezing or rhonchi  ABDOMEN: Soft, non-tender, non-distended MUSCULOSKELETAL:  No edema; No deformity  SKIN: Warm and dry NEUROLOGIC:  Alert and oriented x 3 PSYCHIATRIC:  Normal affect   ASSESSMENT:    1. Angina pectoris ([place])   2. Former smoker   3. Coronary artery disease involving native coronary artery of native heart without angina pectoris    PLAN:    In order of problems listed above:  CAD/angina/abnormal coronary CT scan -Appears to have occluded right coronary artery with left to right collaterals and potential moderate circumflex disease.  We will go ahead and proceed with cardiac catheterization.   Risks and benefits have been explained including stroke heart attack death renal impairment bleeding.  He is willing to proceed.  Radial artery approach. -After catheterization, continue to optimize/intensify medical management.  He admits that he does not like to take the medications.  Essential hypertension -Currently very well controlled with amlodipine 5 mg once a day.  He has been watching his diet, salt.  Try to eat more fruit.  Hyperlipidemia -Agree with atorvastatin.  We will eventually increase his dose to a high intensity statin dose.  Once again he is a little hesitant now to change his medications.  Tobacco cessation -Has continued with this since August 20 20.  Excellent.  We will have him follow-up in about 1 month with APP.   Medication Adjustments/Labs and Tests Ordered: Current medicines are reviewed at length with the patient today.  Concerns regarding medicines are outlined above.  No orders of the defined types were placed in this encounter.  No orders of the defined types were placed in this encounter.   Patient Instructions  Medication Instructions:  The current medical regimen is effective;  continue present plan and medications.  *If you need a refill on your cardiac medications before your next appointment, please call your pharmacy*  Lab Work: You will need blood work and an EKG before your cardiac cath. (BMP, CBC) If you have labs (blood work) drawn today and your tests are completely normal, you will receive your results only by: Marland Kitchen MyChart Message (if you have MyChart) OR . A paper copy in the mail If you have any lab test that is abnormal or we need to change your treatment, we will call you to review the results.   Testing/Procedures: Your physician has requested that you have a cardiac catheterization. Cardiac catheterization is used to diagnose and/or treat various heart conditions. Doctors may recommend this procedure for a number of different  reasons. The most common reason is to evaluate chest pain. Chest pain can be a symptom of coronary artery disease (CAD), and cardiac catheterization can show whether plaque is narrowing or blocking your heart's arteries. This procedure is also used to evaluate the valves, as well as measure the blood flow and oxygen levels in different parts of your heart.   Follow-Up: At [facility], you and your health needs are our priority.  As part of our continuing mission to provide you with exceptional heart care, we have created designated Provider Care Teams.  These Care Teams include your primary Cardiologist (physician) and Advanced Practice Providers (APPs -  Physician Assistants and Nurse Practitioners) who all work together to provide you with the care you need, when you need it.  We recommend signing up for the patient portal called "MyChart".  Sign up information is provided on this After Visit Summary.  MyChart is used to connect with patients for Virtual Visits (Telemedicine).  Patients are able to view lab/test results, encounter notes, upcoming appointments, etc.  Non-urgent messages can be sent to your provider as well.   To learn more about what you can do with MyChart, go to [email or web address].    Your next appointment:   1 month(s)  The format for your next appointment:   In Person  Provider:   You may see one of the following Advanced Practice Providers on your designated Care Team:    Truitt Merle, NP  Cecilie Kicks, NP  Kathyrn Drown, NP    Thank you for choosing [facility]!!     [email or web address]    Procedure: Left cardiac cath CPT: 94174  Please call when you are ready to schedule!!  [phone]    Signed, Candee Furbish, MD  09/07/2019 12:23 PM    [place]

## 2019-09-08 ENCOUNTER — Ambulatory Visit: Payer: Medicare Other | Attending: Internal Medicine | Admitting: Internal Medicine

## 2019-09-08 DIAGNOSIS — K219 Gastro-esophageal reflux disease without esophagitis: Secondary | ICD-10-CM

## 2019-09-08 DIAGNOSIS — I10 Essential (primary) hypertension: Secondary | ICD-10-CM | POA: Diagnosis not present

## 2019-09-08 DIAGNOSIS — E669 Obesity, unspecified: Secondary | ICD-10-CM | POA: Diagnosis not present

## 2019-09-08 DIAGNOSIS — I25118 Atherosclerotic heart disease of native coronary artery with other forms of angina pectoris: Secondary | ICD-10-CM

## 2019-09-08 NOTE — Progress Notes (Signed)
Virtual Visit via Telephone Note Due to current restrictions/limitations of in-office visits due to the COVID-19 pandemic, this scheduled clinical appointment was converted to a telehealth visit  I connected with Casey James on 09/08/19 at 1:34 p.m by telephone and verified that I am speaking with the correct person using two identifiers. I am in my office.  The patient is at home.  Only the patient and myself participated in this encounter.  I discussed the limitations, risks, security and privacy concerns of performing an evaluation and management service by telephone and the availability of in person appointments. I also discussed with the patient that there may be a patient responsible charge related to this service. The patient expressed understanding and agreed to proceed.   History of Present Illness: GERD, former smoker, HL, HTN, obesity, marijuana use  Since last visit pt had coronary CT which revealed completely occluded RCA and possibly moderate proximal LCX stenosis. Cardiac cath recommended. Pt plans to call his insurance today to determine his out of pocket cost.  He is taking the aspirin and atorvastatin as prescribed.  His LDL on recent blood test was 187.  He did follow-up with our clinical pharmacist for repeat blood pressure check and blood pressure was still elevated.  He was started on Norvasc.  He reports compliance with taking the medication and low-salt diet.  Obesity: Reports that since he last saw me he has changed his eating habits.  Eating fruits instead of sweets.  Eating more outmeal.  GERD: Reports symptoms have been less since we have changed the omeprazole to Protonix.  Outpatient Encounter Medications as of 09/08/2019  Medication Sig  . amLODipine (NORVASC) 5 MG tablet Take 1 tablet (5 mg total) by mouth daily.  Marland Kitchen aspirin EC 81 MG tablet Take 81 mg by mouth daily.  Marland Kitchen atorvastatin (LIPITOR) 10 MG tablet Take 1 tablet (10 mg total) by mouth daily.  . Multiple  Vitamins-Minerals (MULTIVITAMIN WITH IRON-MINERALS) liquid Take by mouth daily.  . pantoprazole (PROTONIX) 40 MG tablet Take 1 tablet (40 mg total) by mouth daily.   No facility-administered encounter medications on file as of 09/08/2019.      Observations/Objective: Results for orders placed or performed in visit on 08/19/19  Cologuard  Result Value Ref Range   Cologuard Negative Negative     Chemistry      Component Value Date/Time   NA 138 07/28/2019 1459   K 4.9 07/28/2019 1459   CL 100 07/28/2019 1459   CO2 23 07/28/2019 1459   BUN 11 07/28/2019 1459   CREATININE 1.08 07/28/2019 1459   CREATININE 0.87 07/16/2012 1359      Component Value Date/Time   CALCIUM 9.4 07/28/2019 1459   ALKPHOS 98 07/28/2019 1459   AST 17 07/28/2019 1459   ALT 18 07/28/2019 1459   BILITOT 0.7 07/28/2019 1459     Lab Results  Component Value Date   CHOL 249 (H) 07/28/2019   HDL 36 (L) 07/28/2019   LDLCALC 187 (H) 07/28/2019   TRIG 139 07/28/2019   CHOLHDL 6.9 (H) 07/28/2019     Assessment and Plan: 1. Atherosclerosis of native coronary artery of native heart with stable angina pectoris [facility]) -Patient plans to call his insurance today to find out his out-of-pocket cost for cardiac catheterization then he will call the cardiologist back to schedule.  Continue aspirin and atorvastatin.  I have also recommended sublingual nitroglycerin to use as needed but patient wanted to hold off on that stating that he  will call the ambulance if he has any chest pain that lasts longer than 10 to 15 minutes and does not go away with rest.  2. Essential hypertension Ideally we should change the Norvasc to a beta-blocker but will hold off until after his cardiac procedure  3. Gastroesophageal reflux disease without esophagitis Continue Protonix  4. Obesity (BMI 30-39.9) Commended him on dietary changes.  Encouraged him to keep up the good works.   Follow Up Instructions: 3-4 mths   I discussed the  assessment and treatment plan with the patient. The patient was provided an opportunity to ask questions and all were answered. The patient agreed with the plan and demonstrated an understanding of the instructions.   The patient was advised to call back or seek an in-person evaluation if the symptoms worsen or if the condition fails to improve as anticipated.  I provided 9 minutes of non-face-to-face time during this encounter.   Karle Plumber, MD

## 2019-09-11 ENCOUNTER — Telehealth: Payer: Self-pay | Admitting: Cardiology

## 2019-09-11 DIAGNOSIS — I209 Angina pectoris, unspecified: Secondary | ICD-10-CM

## 2019-09-11 DIAGNOSIS — Z01812 Encounter for preprocedural laboratory examination: Secondary | ICD-10-CM

## 2019-09-11 NOTE — Addendum Note (Signed)
Addended by: Candee Furbish C on: 09/11/2019 02:09 PM   Modules accepted: Orders, SmartSet

## 2019-09-11 NOTE — Telephone Encounter (Signed)
Pt has been scheduled for left heart cath Wednesday 8/4 with Dr Lauree Chandler at 8:30 am.  He is to arrive at 6:30 am.  All instructions sent via MyChart

## 2019-09-11 NOTE — Telephone Encounter (Signed)
Left message for pt to call back to schedule left heart cath for next week.  Meds OK, will need lab and EKG prior to cath.  ? Covid vaccine/exposure.

## 2019-09-11 NOTE — Telephone Encounter (Signed)
  Casey James  09/11/2019  You are scheduled for a Cardiac Cath on Wednesday August 4th with Dr. Lauree Chandler.  1. Please arrive at the [facility] (Main Entrance A) at [facility]: [address] [place], [place] [address] at 6:30 AM  (two hours before your procedure to ensure your preparation). Free valet parking service is available.   Special note: Every effort is made to have your procedure done on time. Please understand that emergencies sometimes delay scheduled procedures.  2. Diet: Nothing to eat or drink after midnight except you may have sips of water with your medications.  3. Labs: Please come to the office for your lab work and EKG on Monday 8/2 at 2 pm.  You may eat and drink as normal this day.  4. Medication instructions in preparation for your procedure: On the morning of your procedure, you may take all your normal morning medications with sips of water.  Please make sure to take your Aspirin.   5. Plan for one night stay--bring personal belongings. 6. Bring a current list of your medications and current insurance cards. 7. You MUST have a responsible person to drive you home. 8. Someone MUST be with you the first 24 hours after you arrive home or your discharge will be delayed. 9. Please wear clothes that are easy to get on and off and wear slip-on shoes.

## 2019-09-11 NOTE — Telephone Encounter (Signed)
Patient is returning call to schedule left heart cath.

## 2019-09-11 NOTE — Telephone Encounter (Signed)
Spoke with patient who would like to schedule his cardiac cath the 1st week of August.  Pt will come in for lab and EKG Monday August 2. He is aware of this appt date and time.   He has had Covid vaccine per his report 07/31/2019 Kai Levins at the [facility]).  He requests instructions for the cardiac cath be sent via MyChart.  He will call back if any further questions or concerns prior to his upcoming appts.

## 2019-09-11 NOTE — Telephone Encounter (Signed)
New message   Patient states that he needs to setup an appt for cardiac cath. Please call to discuss.

## 2019-09-28 ENCOUNTER — Other Ambulatory Visit: Payer: Medicare Other

## 2019-09-28 ENCOUNTER — Ambulatory Visit (INDEPENDENT_AMBULATORY_CARE_PROVIDER_SITE_OTHER): Payer: Medicare Other

## 2019-09-28 ENCOUNTER — Other Ambulatory Visit: Payer: Self-pay

## 2019-09-28 VITALS — HR 68

## 2019-09-28 DIAGNOSIS — I209 Angina pectoris, unspecified: Secondary | ICD-10-CM | POA: Diagnosis not present

## 2019-09-28 DIAGNOSIS — Z01812 Encounter for preprocedural laboratory examination: Secondary | ICD-10-CM

## 2019-09-28 DIAGNOSIS — R079 Chest pain, unspecified: Secondary | ICD-10-CM | POA: Diagnosis not present

## 2019-09-28 LAB — BASIC METABOLIC PANEL
BUN/Creatinine Ratio: 13 (ref 10–24)
BUN: 12 mg/dL (ref 8–27)
CO2: 24 mmol/L (ref 20–29)
Calcium: 9.4 mg/dL (ref 8.6–10.2)
Chloride: 104 mmol/L (ref 96–106)
Creatinine, Ser: 0.92 mg/dL (ref 0.76–1.27)
GFR calc Af Amer: 101 mL/min/{1.73_m2} (ref >59)
GFR calc non Af Amer: 87 mL/min/{1.73_m2} (ref >59)
Glucose: 99 mg/dL (ref 65–99)
Potassium: 4.8 mmol/L (ref 3.5–5.2)
Sodium: 139 mmol/L (ref 134–144)

## 2019-09-28 LAB — CBC
Hematocrit: 43.7 % (ref 37.5–51.0)
Hemoglobin: 14.8 g/dL (ref 13.0–17.7)
MCH: 30.4 pg (ref 26.6–33.0)
MCHC: 33.9 g/dL (ref 31.5–35.7)
MCV: 90 fL (ref 79–97)
Platelets: 217 10*3/uL (ref 150–450)
RBC: 4.87 x10E6/uL (ref 4.14–5.80)
RDW: 15.1 % (ref 11.6–15.4)
WBC: 9.5 10*3/uL (ref 3.4–10.8)

## 2019-09-28 NOTE — Progress Notes (Signed)
1.) Reason for visit: EKG prior to cardiac catheterization  2.) Name of MD requesting visit: Dr. Marlou Porch  3.) H&P: Pt with history of chest pain with abnormal cardiac CT  4.) ROS related to problem: Pt states he is doing well.  Scheduled for cardiac cath on 09/30/2019.  Routine pre EKG completed  5.) Assessment and plan per MD: EKG reviewed by DOD.  No action needed.  Cath scheduled.

## 2019-09-29 ENCOUNTER — Other Ambulatory Visit (HOSPITAL_COMMUNITY)
Admission: RE | Admit: 2019-09-29 | Discharge: 2019-09-29 | Disposition: A | Discharge status: Home / Self Care | Payer: Medicare Other | Source: Ambulatory Visit | Attending: Cardiovascular Disease | Admitting: Cardiovascular Disease

## 2019-09-29 ENCOUNTER — Telehealth: Payer: Self-pay | Admitting: *Deleted

## 2019-09-29 DIAGNOSIS — Z20822 Contact with and (suspected) exposure to covid-19: Secondary | ICD-10-CM | POA: Diagnosis not present

## 2019-09-29 DIAGNOSIS — Z01812 Encounter for preprocedural laboratory examination: Secondary | ICD-10-CM | POA: Diagnosis not present

## 2019-09-29 LAB — SARS CORONAVIRUS 2 (TAT 6-24 HRS): SARS Coronavirus 2: NEGATIVE

## 2019-09-29 NOTE — Telephone Encounter (Signed)
Patient called back returning Anne's Call.

## 2019-09-29 NOTE — Telephone Encounter (Addendum)
Pt contacted pre-catheterization scheduled at [facility] for: Wednesday September 30, 2019 8:30 AM Verified arrival time and place: [facility]) at: 6:30 AM   No solid food after midnight prior to cath, clear liquids until 5 AM day of procedure.   AM meds can be  taken pre-cath with sips of water including: ASA 81 mg   Confirmed patient has responsible adult to drive home post procedure and observe 24 hours after arriving home: yes  You are allowed ONE visitor in the waiting room during your procedure. Both you and your visitor must wear a mask once you enter the hospital.      COVID-19 Pre-Screening Questions:  . In the past 14 days have you had a new cough associated with shortness of breath, fever (100.4 or greater) or sudden loss of taste or sense of smell? no . In the past 14 days have you been around anyone with known Covid 19? No . Any international travel in the past 14 days? no . Have you been vaccinated for COVID-19? Yes, see immunization history   Reviewed procedure/mask/visitor instructions, COVID-19 questions with patient.     LMTCB to review procedure instructions

## 2019-09-30 ENCOUNTER — Other Ambulatory Visit: Payer: Self-pay

## 2019-09-30 ENCOUNTER — Ambulatory Visit (HOSPITAL_COMMUNITY)
Admission: RE | Admit: 2019-09-30 | Discharge: 2019-09-30 | Disposition: A | Discharge status: Home / Self Care | Payer: Medicare Other | Attending: Cardiovascular Disease | Admitting: Cardiovascular Disease

## 2019-09-30 ENCOUNTER — Encounter (HOSPITAL_COMMUNITY)
Admission: RE | Disposition: A | Discharge status: Home / Self Care | Payer: Self-pay | Source: Home / Self Care | Attending: Cardiovascular Disease

## 2019-09-30 ENCOUNTER — Encounter (HOSPITAL_COMMUNITY): Payer: Self-pay | Admitting: Cardiovascular Disease

## 2019-09-30 DIAGNOSIS — Z8249 Family history of ischemic heart disease and other diseases of the circulatory system: Secondary | ICD-10-CM | POA: Diagnosis not present

## 2019-09-30 DIAGNOSIS — I25119 Atherosclerotic heart disease of native coronary artery with unspecified angina pectoris: Secondary | ICD-10-CM | POA: Diagnosis not present

## 2019-09-30 DIAGNOSIS — Z87891 Personal history of nicotine dependence: Secondary | ICD-10-CM | POA: Diagnosis not present

## 2019-09-30 DIAGNOSIS — I10 Essential (primary) hypertension: Secondary | ICD-10-CM | POA: Insufficient documentation

## 2019-09-30 DIAGNOSIS — Z7982 Long term (current) use of aspirin: Secondary | ICD-10-CM | POA: Diagnosis not present

## 2019-09-30 DIAGNOSIS — Z79899 Other long term (current) drug therapy: Secondary | ICD-10-CM | POA: Diagnosis not present

## 2019-09-30 DIAGNOSIS — I2510 Atherosclerotic heart disease of native coronary artery without angina pectoris: Secondary | ICD-10-CM | POA: Diagnosis not present

## 2019-09-30 DIAGNOSIS — I2582 Chronic total occlusion of coronary artery: Secondary | ICD-10-CM | POA: Insufficient documentation

## 2019-09-30 DIAGNOSIS — K219 Gastro-esophageal reflux disease without esophagitis: Secondary | ICD-10-CM | POA: Diagnosis not present

## 2019-09-30 DIAGNOSIS — E785 Hyperlipidemia, unspecified: Secondary | ICD-10-CM | POA: Diagnosis not present

## 2019-09-30 DIAGNOSIS — J439 Emphysema, unspecified: Secondary | ICD-10-CM | POA: Insufficient documentation

## 2019-09-30 DIAGNOSIS — I209 Angina pectoris, unspecified: Secondary | ICD-10-CM

## 2019-09-30 DIAGNOSIS — R079 Chest pain, unspecified: Secondary | ICD-10-CM | POA: Diagnosis present

## 2019-09-30 HISTORY — PX: LEFT HEART CATH AND CORONARY ANGIOGRAPHY: CATH118249

## 2019-09-30 SURGERY — LEFT HEART CATH AND CORONARY ANGIOGRAPHY
Anesthesia: LOCAL

## 2019-09-30 MED ORDER — LIDOCAINE HCL (PF) 1 % IJ SOLN
INTRAMUSCULAR | Status: AC
  Filled 2019-09-30: qty 30

## 2019-09-30 MED ORDER — SODIUM CHLORIDE 0.9 % IV SOLN: 250.0000 mL | INTRAVENOUS | Status: DC | PRN

## 2019-09-30 MED ORDER — SODIUM CHLORIDE 0.9% FLUSH: 3.0000 mL | INTRAVENOUS | Status: DC | PRN

## 2019-09-30 MED ORDER — VERAPAMIL HCL 2.5 MG/ML IV SOLN
INTRAVENOUS | Status: DC | PRN
  Administered 2019-09-30: 10 mL via INTRA_ARTERIAL

## 2019-09-30 MED ORDER — ACETAMINOPHEN 325 MG PO TABS: 650.0000 mg | ORAL_TABLET | ORAL | Status: DC | PRN

## 2019-09-30 MED ORDER — HYDRALAZINE HCL 20 MG/ML IJ SOLN: 10.0000 mg | INTRAMUSCULAR | Status: DC | PRN

## 2019-09-30 MED ORDER — HEPARIN (PORCINE) IN NACL 1000-0.9 UT/500ML-% IV SOLN
INTRAVENOUS | Status: AC
  Filled 2019-09-30: qty 1000

## 2019-09-30 MED ORDER — HEPARIN SODIUM (PORCINE) 1000 UNIT/ML IJ SOLN
INTRAMUSCULAR | Status: AC
  Filled 2019-09-30: qty 1

## 2019-09-30 MED ORDER — SODIUM CHLORIDE 0.9 % WEIGHT BASED INFUSION: 1.0000 mL/kg/h | INTRAVENOUS | Status: DC

## 2019-09-30 MED ORDER — LIDOCAINE HCL (PF) 1 % IJ SOLN
INTRAMUSCULAR | Status: DC | PRN
  Administered 2019-09-30: 2 mL

## 2019-09-30 MED ORDER — MIDAZOLAM HCL 2 MG/2ML IJ SOLN
INTRAMUSCULAR | Status: AC
  Filled 2019-09-30: qty 2

## 2019-09-30 MED ORDER — LABETALOL HCL 5 MG/ML IV SOLN: 10.0000 mg | INTRAVENOUS | Status: DC | PRN

## 2019-09-30 MED ORDER — FENTANYL CITRATE (PF) 100 MCG/2ML IJ SOLN
INTRAMUSCULAR | Status: AC
  Filled 2019-09-30: qty 2

## 2019-09-30 MED ORDER — ASPIRIN 81 MG PO CHEW: 81.0000 mg | CHEWABLE_TABLET | ORAL | Status: DC

## 2019-09-30 MED ORDER — VERAPAMIL HCL 2.5 MG/ML IV SOLN
INTRAVENOUS | Status: AC
  Filled 2019-09-30: qty 2

## 2019-09-30 MED ORDER — SODIUM CHLORIDE 0.9 % IV SOLN: INTRAVENOUS | Status: AC

## 2019-09-30 MED ORDER — SODIUM CHLORIDE 0.9% FLUSH: 3.0000 mL | Freq: Two times a day (BID) | INTRAVENOUS | Status: DC

## 2019-09-30 MED ORDER — ONDANSETRON HCL 4 MG/2ML IJ SOLN: 4.0000 mg | Freq: Four times a day (QID) | INTRAMUSCULAR | Status: DC | PRN

## 2019-09-30 MED ORDER — MIDAZOLAM HCL 2 MG/2ML IJ SOLN
INTRAMUSCULAR | Status: DC | PRN
  Administered 2019-09-30: 2 mg via INTRAVENOUS

## 2019-09-30 MED ORDER — HEPARIN SODIUM (PORCINE) 1000 UNIT/ML IJ SOLN
INTRAMUSCULAR | Status: DC | PRN
  Administered 2019-09-30: 5000 [IU] via INTRAVENOUS

## 2019-09-30 MED ORDER — SODIUM CHLORIDE 0.9 % WEIGHT BASED INFUSION
3.0000 mL/kg/h | INTRAVENOUS | Status: AC
  Administered 2019-09-30: 3 mL/kg/h via INTRAVENOUS

## 2019-09-30 MED ORDER — HEPARIN (PORCINE) IN NACL 1000-0.9 UT/500ML-% IV SOLN
INTRAVENOUS | Status: DC | PRN
  Administered 2019-09-30 (×2): 500 mL

## 2019-09-30 MED ORDER — IOHEXOL 350 MG/ML SOLN
INTRAVENOUS | Status: DC | PRN
  Administered 2019-09-30: 60 mL

## 2019-09-30 MED ORDER — FENTANYL CITRATE (PF) 100 MCG/2ML IJ SOLN
INTRAMUSCULAR | Status: DC | PRN
  Administered 2019-09-30: 50 ug via INTRAVENOUS

## 2019-09-30 SURGICAL SUPPLY — 10 items
CATH 5FR JL3.5 JR4 ANG PIG MP (CATHETERS) ×2 IMPLANT
DEVICE RAD COMP TR BAND LRG (VASCULAR PRODUCTS) ×2 IMPLANT
GLIDESHEATH SLEND SS 6F .021 (SHEATH) ×2 IMPLANT
GUIDEWIRE INQWIRE 1.5J.035X260 (WIRE) ×1 IMPLANT
INQWIRE 1.5J .035X260CM (WIRE) ×2
KIT HEART LEFT (KITS) ×2 IMPLANT
PACK CARDIAC CATHETERIZATION (CUSTOM PROCEDURE TRAY) ×2 IMPLANT
SYR MEDRAD MARK 7 150ML (SYRINGE) ×2 IMPLANT
TRANSDUCER W/STOPCOCK (MISCELLANEOUS) ×2 IMPLANT
TUBING CIL FLEX 10 FLL-RA (TUBING) ×2 IMPLANT

## 2019-09-30 NOTE — Discharge Instructions (Signed)
Radial Site Care  This sheet gives you information about how to care for yourself after your procedure. Your health care provider may also give you more specific instructions. If you have problems or questions, contact your health care provider. What can I expect after the procedure? After the procedure, it is common to have:  Bruising and tenderness at the catheter insertion area. Follow these instructions at home: Medicines  Take over-the-counter and prescription medicines only as told by your health care provider. Insertion site care  Follow instructions from your health care provider about how to take care of your insertion site. Make sure you: ? Wash your hands with soap and water before you change your bandage (dressing). If soap and water are not available, use hand sanitizer. ? Change your dressing as told by your health care provider. ? Leave stitches (sutures), skin glue, or adhesive strips in place. These skin closures may need to stay in place for 2 weeks or longer. If adhesive strip edges start to loosen and curl up, you may trim the loose edges. Do not remove adhesive strips completely unless your health care provider tells you to do that.  Check your insertion site every day for signs of infection. Check for: ? Redness, swelling, or pain. ? Fluid or blood. ? Pus or a bad smell. ? Warmth.  Do not take baths, swim, or use a hot tub until your health care provider approves.  You may shower 24-48 hours after the procedure, or as directed by your health care provider. ? Remove the dressing and gently wash the site with plain soap and water. ? Pat the area dry with a clean towel. ? Do not rub the site. That could cause bleeding.  Do not apply powder or lotion to the site. Activity   For 24 hours after the procedure, or as directed by your health care provider: ? Do not flex or bend the affected arm. ? Do not push or pull heavy objects with the affected arm. ? Do not  drive yourself home from the hospital or clinic. You may drive 24 hours after the procedure unless your health care provider tells you not to. ? Do not operate machinery or power tools.  Do not lift anything that is heavier than 10 lb (4.5 kg), or the limit that you are told, until your health care provider says that it is safe.  Ask your health care provider when it is okay to: ? Return to work or school. ? Resume usual physical activities or sports. ? Resume sexual activity. General instructions  If the catheter site starts to bleed, raise your arm and put firm pressure on the site. If the bleeding does not stop, get help right away. This is a medical emergency.  If you went home on the same day as your procedure, a responsible adult should be with you for the first 24 hours after you arrive home.  Keep all follow-up visits as told by your health care provider. This is important. Contact a health care provider if:  You have a fever.  You have redness, swelling, or yellow drainage around your insertion site. Get help right away if:  You have unusual pain at the radial site.  The catheter insertion area swells very fast.  The insertion area is bleeding, and the bleeding does not stop when you hold steady pressure on the area.  Your arm or hand becomes pale, cool, tingly, or numb. These symptoms may represent a serious problem   that is an emergency. Do not wait to see if the symptoms will go away. Get medical help right away. Call your local emergency services (911 in the [place] Do not drive yourself to the hospital. Summary  After the procedure, it is common to have bruising and tenderness at the site.  Follow instructions from your health care provider about how to take care of your radial site wound. Check the wound every day for signs of infection.  Do not lift anything that is heavier than 10 lb (4.5 kg), or the limit that you are told, until your health care provider says  that it is safe. This information is not intended to replace advice given to you by your health care provider. Make sure you discuss any questions you have with your health care provider. Document Revised: 03/20/2017 Document Reviewed: 03/20/2017 Elsevier Patient Education  2020 [employer]  

## 2019-09-30 NOTE — Progress Notes (Signed)
Discharge instructions reviewed with patient and family. Verbalized understanding. 

## 2019-09-30 NOTE — Interval H&P Note (Signed)
History and Physical Interval Note:  09/30/2019 7:54 AM  Casey James  has presented today for surgery, with the diagnosis of abnormal CT, Chest pain.  The various methods of treatment have been discussed with the patient and family. After consideration of risks, benefits and other options for treatment, the patient has consented to  Procedure(s): LEFT HEART CATH AND CORONARY ANGIOGRAPHY (N/A) as a surgical intervention.  The patient's history has been reviewed, patient examined, no change in status, stable for surgery.  I have reviewed the patient's chart and labs.  Questions were answered to the patient's satisfaction.    Cath Lab Visit (complete for each Cath Lab visit)  Clinical Evaluation Leading to the Procedure:   ACS: No.  Non-ACS:    Anginal Classification: CCS II  Anti-ischemic medical therapy: Minimal Therapy (1 class of medications)  Non-Invasive Test Results: High-risk stress test findings: cardiac mortality >3%/year (High grade disease on coronary CTA)  Prior CABG: No previous CABG        Lauree Chandler

## 2019-10-31 ENCOUNTER — Other Ambulatory Visit: Payer: Self-pay | Admitting: Internal Medicine

## 2019-10-31 DIAGNOSIS — I10 Essential (primary) hypertension: Secondary | ICD-10-CM

## 2019-10-31 NOTE — Telephone Encounter (Signed)
Requested Prescriptions  Pending Prescriptions Disp Refills   amLODipine (NORVASC) 5 MG tablet [Pharmacy Med Name: amLODIPine Besylate 5 MG Oral Tablet] 90 tablet 1    Sig: Take 1 tablet by mouth once daily     Cardiovascular:  Calcium Channel Blockers Failed - 10/31/2019 11:34 AM      Failed - Last BP in normal range    BP Readings from Last 1 Encounters:  09/30/19 (!) 151/78         Passed - Valid encounter within last 6 months    Recent Outpatient Visits          1 month ago Atherosclerosis of native coronary artery of native heart with stable angina pectoris [facility])   [place], MD   2 months ago Essential hypertension   [place], Annie Main L, RPH-CPP   3 months ago Gastroesophageal reflux disease without esophagitis   [place], MD   4 months ago Chest pain in adult   [place], MD   7 years ago Routine general medical examination at a health care facility   [facility], MD      Future Appointments            In 2 weeks Marlou Porch, Thana Farr, MD [place], [place]   In 2 months Wynetta Emery, Dalbert Batman, MD [place]

## 2019-11-17 ENCOUNTER — Ambulatory Visit: Payer: Medicare Other | Admitting: Cardiology

## 2019-11-17 ENCOUNTER — Encounter: Payer: Self-pay | Admitting: Cardiology

## 2019-11-17 ENCOUNTER — Other Ambulatory Visit: Payer: Self-pay

## 2019-11-17 VITALS — BP 118/68 | HR 72 | Ht 69.0 in | Wt 210.0 lb

## 2019-11-17 DIAGNOSIS — I209 Angina pectoris, unspecified: Secondary | ICD-10-CM

## 2019-11-17 DIAGNOSIS — I2510 Atherosclerotic heart disease of native coronary artery without angina pectoris: Secondary | ICD-10-CM

## 2019-11-17 MED ORDER — METOPROLOL SUCCINATE 25 MG PO CS24: 25.0000 mg | EXTENDED_RELEASE_CAPSULE | Freq: Every day | ORAL | 3 refills | Status: DC

## 2019-11-17 MED ORDER — ISOSORBIDE MONONITRATE ER 30 MG PO TB24
30.0000 mg | ORAL_TABLET | Freq: Every day | ORAL | 3 refills | Status: DC
Start: 2019-11-17 — End: 2020-11-07

## 2019-11-17 MED ORDER — ROSUVASTATIN CALCIUM 20 MG PO TABS
20.0000 mg | ORAL_TABLET | Freq: Every day | ORAL | 3 refills | Status: DC
Start: 2019-11-17 — End: 2020-11-07

## 2019-11-17 NOTE — Patient Instructions (Signed)
Medication Instructions:  Please start Isosorbide 30 mg a day. Start Metoprolol Succinate 25 mg a day. Stop Atorvastatin and start Crestor 20 mg a day. Continue all other medications as listed.  *If you need a refill on your cardiac medications before your next appointment, please call your pharmacy*  Follow-Up: At [facility], you and your health needs are our priority.  As part of our continuing mission to provide you with exceptional heart care, we have created designated Provider Care Teams.  These Care Teams include your primary Cardiologist (physician) and Advanced Practice Providers (APPs -  Physician Assistants and Nurse Practitioners) who all work together to provide you with the care you need, when you need it.  We recommend signing up for the patient portal called "MyChart".  Sign up information is provided on this After Visit Summary.  MyChart is used to connect with patients for Virtual Visits (Telemedicine).  Patients are able to view lab/test results, encounter notes, upcoming appointments, etc.  Non-urgent messages can be sent to your provider as well.   To learn more about what you can do with MyChart, go to [email or web address].    Your next appointment:   3 month(s)  The format for your next appointment:   In Person  Provider:   Candee Furbish, MD   Thank you for choosing [facility]!!

## 2019-11-17 NOTE — Progress Notes (Signed)
Cardiology Office Note:    Date:  11/17/2019   ID:  Casey James, DOB 01/27/1956, MRN [id number]  PCP:  Ladell Pier, MD  [facility] Cardiologist:  Candee Furbish, MD  [facility] Electrophysiologist:  None   Referring MD: Ladell Pier, MD    History of Present Illness:    Casey James is a 64 y.o. male here for post catheterization follow-up.   Prox RCA lesion is 100% stenosed.  Ost Cx to Prox Cx lesion is 50% stenosed.  1st Mrg lesion is 40% stenosed.  Prox LAD to Mid LAD lesion is 30% stenosed.  Dist LAD lesion is 30% stenosed.  1st Diag lesion is 40% stenosed.  The left ventricular systolic function is normal.  LV end diastolic pressure is normal.  The left ventricular ejection fraction is greater than 65% by visual estimate.  There is no mitral valve regurgitation.   1. Chronic total occlusion of the proximal RCA. The mid and distal RCA fills briskly from left to right collaterals. The RCA is a large dominant vessel. Collaterals supplied by the LAD 2. There is mild to moderate non-obstructive plaque in the proximal Circumflex and high first obtuse marginal branch. This is a moderate caliber non-dominant vessel.  3. The LAD has mild plaque in the proximal and mid vessel.  4. Normal LV systolic function  Recommendations: Medical management of CAD. The collaterals to the chronically occluded RCA arise from the LAD. The disease in the proximal Circumflex does not appear to be obstructive. If he has breakthrough angina that cannot be controlled with medical therapy, could consider review of the cath films by the CTO team to discuss options for PCI of the RCA.   Diagnostic Dominance: Right  Intervention    Working in yard, pulling raking, felt some angina. 160/68 then. 170/80.   Past Medical History:  Diagnosis Date  . Chest pain   . Emphysema of lung ([place])   . Former smoker   . GERD (gastroesophageal reflux disease)     Past Surgical  History:  Procedure Laterality Date  . CHOLECYSTECTOMY    . FRACTURE SURGERY    . LEFT HEART CATH AND CORONARY ANGIOGRAPHY N/A 09/30/2019   Procedure: LEFT HEART CATH AND CORONARY ANGIOGRAPHY;  Surgeon: Burnell Blanks, MD;  Location: [facility];  Service: Cardiovascular;  Laterality: N/A;  . pulmonary fibrosis      Current Medications: Current Meds  Medication Sig  . amLODipine (NORVASC) 5 MG tablet Take 1 tablet by mouth once daily  . aspirin EC 81 MG tablet Take 81 mg by mouth daily.  . Multiple Vitamins-Minerals (CENTRUM) tablet Take 1 tablet by mouth daily.   . pantoprazole (PROTONIX) 40 MG tablet Take 1 tablet (40 mg total) by mouth daily.  . [DISCONTINUED] atorvastatin (LIPITOR) 10 MG tablet Take 1 tablet (10 mg total) by mouth daily.     Allergies:   Patient has no known allergies.   Social History   Socioeconomic History  . Marital status: Married    Spouse name: Not on file  . Number of children: Not on file  . Years of education: Not on file  . Highest education level: Not on file  Occupational History  . Not on file  Tobacco Use  . Smoking status: Former Smoker    Years: 50.00    Quit date: 10/21/2018    Years since quitting: 1.0  . Smokeless tobacco: Never Used  Substance and Sexual Activity  .  Alcohol use: Not on file  . Drug use: Not on file  . Sexual activity: Not on file  Other Topics Concern  . Not on file  Social History Narrative  . Not on file   Social Determinants of Health   Financial Resource Strain:   . Difficulty of Paying Living Expenses: Not on file  Food Insecurity:   . Worried About Charity fundraiser in the Last Year: Not on file  . Ran Out of Food in the Last Year: Not on file  Transportation Needs:   . Lack of Transportation (Medical): Not on file  . Lack of Transportation (Non-Medical): Not on file  Physical Activity:   . Days of Exercise per Week: Not on file  . Minutes of Exercise per Session: Not on file    Stress:   . Feeling of Stress : Not on file  Social Connections:   . Frequency of Communication with Friends and Family: Not on file  . Frequency of Social Gatherings with Friends and Family: Not on file  . Attends Religious Services: Not on file  . Active Member of Clubs or Organizations: Not on file  . Attends Archivist Meetings: Not on file  . Marital Status: Not on file     Family History: The patient's family history includes Cancer in his mother; Diabetes in his brother; Heart disease in his father; Pulmonary fibrosis in his mother.  ROS:   Please see the history of present illness.     All other systems reviewed and are negative.  EKGs/Labs/Other Studies Reviewed:    The following studies were reviewed today: cath   Recent Labs: 07/28/2019: ALT 18 09/28/2019: BUN 12; Creatinine, Ser 0.92; Hemoglobin 14.8; Platelets 217; Potassium 4.8; Sodium 139  Recent Lipid Panel    Component Value Date/Time   CHOL 249 (H) 07/28/2019 1459   TRIG 139 07/28/2019 1459   HDL 36 (L) 07/28/2019 1459   CHOLHDL 6.9 (H) 07/28/2019 1459   CHOLHDL 5.8 07/16/2012 1359   VLDL 36 07/16/2012 1359   LDLCALC 187 (H) 07/28/2019 1459    Physical Exam:    VS:  BP 118/68   Pulse 72   Ht 5\' 9"  (1.753 m)   Wt 210 lb (95.3 kg)   SpO2 98%   BMI 31.01 kg/m     Wt Readings from Last 3 Encounters:  11/17/19 210 lb (95.3 kg)  09/30/19 205 lb (93 kg)  09/07/19 205 lb (93 kg)     GEN:  Well nourished, well developed in no acute distress HEENT: Normal NECK: No JVD; No carotid bruits LYMPHATICS: No lymphadenopathy CARDIAC: RRR, no murmurs, rubs, gallops RESPIRATORY:  Clear to auscultation without rales, wheezing or rhonchi  ABDOMEN: Soft, non-tender, non-distended MUSCULOSKELETAL:  No edema; No deformity  SKIN: Warm and dry NEUROLOGIC:  Alert and oriented x 3 PSYCHIATRIC:  Normal affect   ASSESSMENT:    1. Angina pectoris ([place])   2. Coronary artery disease involving native  coronary artery of native heart without angina pectoris    PLAN:    In order of problems listed above:  Exertional angina -Occluded RCA with collaterals. -Continue with amlodipine 5 mg a day -Add isosorbide 30, Toprol 25 "See if we can make him feel better. Always an option for CTO team to evaluate.  Coronary artery disease -Aggressive secondary risk factor prevention.  Cardiac cath explained to him as above.  Hyperlipidemia -LDL 187 at 1 point.  Stop atorvastatin 10, start Crestor 20.  We will check LDL again in 3 months.  Return visit in 3 months.   Medication Adjustments/Labs and Tests Ordered: Current medicines are reviewed at length with the patient today.  Concerns regarding medicines are outlined above.  No orders of the defined types were placed in this encounter.  Meds ordered this encounter  Medications  . isosorbide mononitrate (IMDUR) 30 MG 24 hr tablet    Sig: Take 1 tablet (30 mg total) by mouth daily.    Dispense:  90 tablet    Refill:  3  . Metoprolol Succinate 25 MG CS24    Sig: Take 25 mg by mouth daily.    Dispense:  90 capsule    Refill:  3  . rosuvastatin (CRESTOR) 20 MG tablet    Sig: Take 1 tablet (20 mg total) by mouth daily.    Dispense:  90 tablet    Refill:  3    Patient Instructions  Medication Instructions:  Please start Isosorbide 30 mg a day. Start Metoprolol Succinate 25 mg a day. Stop Atorvastatin and start Crestor 20 mg a day. Continue all other medications as listed.  *If you need a refill on your cardiac medications before your next appointment, please call your pharmacy*  Follow-Up: At [facility], you and your health needs are our priority.  As part of our continuing mission to provide you with exceptional heart care, we have created designated Provider Care Teams.  These Care Teams include your primary Cardiologist (physician) and Advanced Practice Providers (APPs -  Physician Assistants and Nurse Practitioners) who all  work together to provide you with the care you need, when you need it.  We recommend signing up for the patient portal called "MyChart".  Sign up information is provided on this After Visit Summary.  MyChart is used to connect with patients for Virtual Visits (Telemedicine).  Patients are able to view lab/test results, encounter notes, upcoming appointments, etc.  Non-urgent messages can be sent to your provider as well.   To learn more about what you can do with MyChart, go to [email or web address].    Your next appointment:   3 month(s)  The format for your next appointment:   In Person  Provider:   Candee Furbish, MD   Thank you for choosing [facility]!!         Signed, Candee Furbish, MD  11/17/2019 4:51 PM    [place]

## 2019-11-19 ENCOUNTER — Telehealth: Payer: Self-pay | Admitting: Cardiology

## 2019-11-19 NOTE — Telephone Encounter (Signed)
New Message:    Please call, have some questions about his Isosorbide.

## 2019-11-19 NOTE — Telephone Encounter (Signed)
Patient asking if OK to take Imdur at night.  I advised him to try and take in the morning but it could be taken at night if needed.  I told him if problems with headache in the morning after taking he could switch to evening to see if this helps. He is asking about exercise.  Note reviewed and no limitations mentioned.  I told patient he could gradually start a walking program

## 2019-12-01 ENCOUNTER — Other Ambulatory Visit: Payer: Self-pay | Admitting: Internal Medicine

## 2019-12-01 DIAGNOSIS — K219 Gastro-esophageal reflux disease without esophagitis: Secondary | ICD-10-CM

## 2019-12-01 NOTE — Telephone Encounter (Signed)
Requested Prescriptions  Pending Prescriptions Disp Refills   pantoprazole (PROTONIX) 40 MG tablet [Pharmacy Med Name: Pantoprazole Sodium 40 MG Oral Tablet Delayed Release] 90 tablet 3    Sig: Take 1 tablet by mouth once daily     Gastroenterology: Proton Pump Inhibitors Passed - 12/01/2019  8:28 PM      Passed - Valid encounter within last 12 months    Recent Outpatient Visits          2 months ago Atherosclerosis of native coronary artery of native heart with stable angina pectoris [facility])   [place], MD   3 months ago Essential hypertension   [place], Jarome [place]atin, RPH-CPP   4 months ago Gastroesophageal reflux disease without esophagitis   [place], MD   5 months ago Chest pain in adult   [place], MD   7 years ago Routine general medical examination at a health care facility   [facility] at [facility], MD      Future Appointments            In 1 month Wynetta Emery Dalbert Batman, MD [place]   In 2 months [place], Thana Farr, MD [place], [facility]

## 2020-01-12 ENCOUNTER — Encounter: Payer: Self-pay | Admitting: Internal Medicine

## 2020-01-12 ENCOUNTER — Ambulatory Visit: Payer: Medicare Other | Attending: Internal Medicine | Admitting: Internal Medicine

## 2020-01-12 ENCOUNTER — Other Ambulatory Visit: Payer: Self-pay

## 2020-01-12 VITALS — BP 110/60 | HR 58 | Resp 16 | Wt 215.2 lb

## 2020-01-12 DIAGNOSIS — E669 Obesity, unspecified: Secondary | ICD-10-CM | POA: Diagnosis not present

## 2020-01-12 DIAGNOSIS — Z2821 Immunization not carried out because of patient refusal: Secondary | ICD-10-CM | POA: Diagnosis not present

## 2020-01-12 DIAGNOSIS — I25118 Atherosclerotic heart disease of native coronary artery with other forms of angina pectoris: Secondary | ICD-10-CM

## 2020-01-12 MED ORDER — NITROGLYCERIN 0.4 MG SL SUBL: 0.4000 mg | SUBLINGUAL_TABLET | SUBLINGUAL | 3 refills | Status: DC | PRN

## 2020-01-12 NOTE — Patient Instructions (Signed)
Healthy Eating Following a healthy eating pattern may help you to achieve and maintain a healthy body weight, reduce the risk of chronic disease, and live a long and productive life. It is important to follow a healthy eating pattern at an appropriate calorie level for your body. Your nutritional needs should be met primarily through food by choosing a variety of nutrient-rich foods. What are tips for following this plan? Reading food labels  Read labels and choose the following: ? Reduced or low sodium. ? Juices with 100% fruit juice. ? Foods with low saturated fats and high polyunsaturated and monounsaturated fats. ? Foods with whole grains, such as whole wheat, cracked wheat, brown rice, and wild rice. ? Whole grains that are fortified with folic acid. This is recommended for women who are pregnant or who want to become pregnant.  Read labels and avoid the following: ? Foods with a lot of added sugars. These include foods that contain brown sugar, corn sweetener, corn syrup, dextrose, fructose, glucose, high-fructose corn syrup, honey, invert sugar, lactose, malt syrup, maltose, molasses, raw sugar, sucrose, trehalose, or turbinado sugar.  Do not eat more than the following amounts of added sugar per day:  6 teaspoons (25 g) for women.  9 teaspoons (38 g) for men. ? Foods that contain processed or refined starches and grains. ? Refined grain products, such as white flour, degermed cornmeal, white bread, and white rice. Shopping  Choose nutrient-rich snacks, such as vegetables, whole fruits, and nuts. Avoid high-calorie and high-sugar snacks, such as potato chips, fruit snacks, and candy.  Use oil-based dressings and spreads on foods instead of solid fats such as butter, stick margarine, or cream cheese.  Limit pre-made sauces, mixes, and "instant" products such as flavored rice, instant noodles, and ready-made pasta.  Try more plant-protein sources, such as tofu, tempeh, black beans,  edamame, lentils, nuts, and seeds.  Explore eating plans such as the Mediterranean diet or vegetarian diet. Cooking  Use oil to saut or stir-fry foods instead of solid fats such as butter, stick margarine, or lard.  Try baking, boiling, grilling, or broiling instead of frying.  Remove the fatty part of meats before cooking.  Steam vegetables in water or broth. Meal planning   At meals, imagine dividing your plate into fourths: ? One-half of your plate is fruits and vegetables. ? One-fourth of your plate is whole grains. ? One-fourth of your plate is protein, especially lean meats, poultry, eggs, tofu, beans, or nuts.  Include low-fat dairy as part of your daily diet. Lifestyle  Choose healthy options in all settings, including home, work, school, restaurants, or stores.  Prepare your food safely: ? Wash your hands after handling raw meats. ? Keep food preparation surfaces clean by regularly washing with hot, soapy water. ? Keep raw meats separate from ready-to-eat foods, such as fruits and vegetables. ? Cook seafood, meat, poultry, and eggs to the recommended internal temperature. ? Store foods at safe temperatures. In general:  Keep cold foods at 59F (4.4C) or below.  Keep hot foods at 159F (60C) or above.  Keep your freezer at [facility] (-17.8C) or below.  Foods are no longer safe to eat when they have been between the temperatures of 40-159F (4.4-60C) for more than 2 hours. What foods should I eat? Fruits Aim to eat 2 cup-equivalents of fresh, canned (in natural juice), or frozen fruits each day. Examples of 1 cup-equivalent of fruit include 1 small apple, 8 large strawberries, 1 cup canned fruit,  cup  dried fruit, or 1 cup 100% juice. Vegetables Aim to eat 2-3 cup-equivalents of fresh and frozen vegetables each day, including different varieties and colors. Examples of 1 cup-equivalent of vegetables include 2 medium carrots, 2 cups raw, leafy greens, 1 cup chopped  vegetable (raw or cooked), or 1 medium baked potato. Grains Aim to eat 6 ounce-equivalents of whole grains each day. Examples of 1 ounce-equivalent of grains include 1 slice of bread, 1 cup ready-to-eat cereal, 3 cups popcorn, or  cup cooked rice, pasta, or cereal. Meats and other proteins Aim to eat 5-6 ounce-equivalents of protein each day. Examples of 1 ounce-equivalent of protein include 1 egg, 1/2 cup nuts or seeds, or 1 tablespoon (16 g) peanut butter. A cut of meat or fish that is the size of a deck of cards is about 3-4 ounce-equivalents.  Of the protein you eat each week, try to have at least 8 ounces come from seafood. This includes salmon, trout, herring, and anchovies. Dairy Aim to eat 3 cup-equivalents of fat-free or low-fat dairy each day. Examples of 1 cup-equivalent of dairy include 1 cup (240 mL) milk, 8 ounces (250 g) yogurt, 1 ounces (44 g) natural cheese, or 1 cup (240 mL) fortified soy milk. Fats and oils  Aim for about 5 teaspoons (21 g) per day. Choose monounsaturated fats, such as canola and olive oils, avocados, peanut butter, and most nuts, or polyunsaturated fats, such as sunflower, corn, and soybean oils, walnuts, pine nuts, sesame seeds, sunflower seeds, and flaxseed. Beverages  Aim for six 8-oz glasses of water per day. Limit coffee to three to five 8-oz cups per day.  Limit caffeinated beverages that have added calories, such as soda and energy drinks.  Limit alcohol intake to no more than 1 drink a day for nonpregnant women and 2 drinks a day for men. One drink equals 12 oz of beer (355 mL), 5 oz of wine (148 mL), or 1 oz of hard liquor (44 mL). Seasoning and other foods  Avoid adding excess amounts of salt to your foods. Try flavoring foods with herbs and spices instead of salt.  Avoid adding sugar to foods.  Try using oil-based dressings, sauces, and spreads instead of solid fats. This information is based on general [place] nutrition guidelines. For more  information, visit Build[employer].es. Exact amounts may vary based on your nutrition needs. Summary  A healthy eating plan may help you to maintain a healthy weight, reduce the risk of chronic diseases, and stay active throughout your life.  Plan your meals. Make sure you eat the right portions of a variety of nutrient-rich foods.  Try baking, boiling, grilling, or broiling instead of frying.  Choose healthy options in all settings, including home, work, school, restaurants, or stores. This information is not intended to replace advice given to you by your health care provider. Make sure you discuss any questions you have with your health care provider. Document Revised: 05/27/2017 Document Reviewed: 05/27/2017 Elsevier Patient Education  [place].

## 2020-01-12 NOTE — Progress Notes (Signed)
Patient ID: Casey James, male    DOB: 09/05/1955  MRN: [id number]  CC: Hypertension   Subjective: Casey James is a 64 y.o. male who presents for chronic ds management His concerns today include:  GERD, former smoker, HL, HTN, obesity, marijuana use, CAD  CAD:  Had cardiac cath 10/2019.  Found to have completely occluded RCA and 30 to 50% stenosis in some of the other vessels. Plan for medical management. Reports that the first time he took Imdur he felt dizzy and diaphoretic and had a syncopal episode.  No further episodes since then.  He has continued on the medication.  -Reports still gets CP with exertion but not as severe as before. -Has f/u with cardiology next mth  Weight has increased.  He attributes this to eating more since he stopped smoking.  He has cut back on drinking sodas.  He now drinks 1 soda a day.  He feels that bread is his weakness.  Patient Active Problem List   Diagnosis Date Noted  . Influenza vaccine refused 01/12/2020  . Coronary artery disease involving native coronary artery of native heart without angina pectoris   . Essential hypertension 09/08/2019  . Obesity (BMI 30-39.9) 07/28/2019  . Marijuana user 07/28/2019  . Chest pain in adult 06/23/2019  . Gastroesophageal reflux disease without esophagitis 06/23/2019  . Former smoker 06/23/2019  . Ptosis of eyelid, right 07/13/2011     Current Outpatient Medications on File Prior to Visit  Medication Sig Dispense Refill  . amLODipine (NORVASC) 5 MG tablet Take 1 tablet by mouth once daily 90 tablet 1  . aspirin EC 81 MG tablet Take 81 mg by mouth daily.    . isosorbide mononitrate (IMDUR) 30 MG 24 hr tablet Take 1 tablet (30 mg total) by mouth daily. 90 tablet 3  . Metoprolol Succinate 25 MG CS24 Take 25 mg by mouth daily. 90 capsule 3  . Multiple Vitamins-Minerals (CENTRUM) tablet Take 1 tablet by mouth daily.     . pantoprazole (PROTONIX) 40 MG tablet Take 1 tablet by mouth once daily 90 tablet  3  . rosuvastatin (CRESTOR) 20 MG tablet Take 1 tablet (20 mg total) by mouth daily. 90 tablet 3   No current facility-administered medications on file prior to visit.    No Known Allergies  Social History   Socioeconomic History  . Marital status: Married    Spouse name: Not on file  . Number of children: Not on file  . Years of education: Not on file  . Highest education level: Not on file  Occupational History  . Not on file  Tobacco Use  . Smoking status: Former Smoker    Years: 50.00    Quit date: 10/21/2018    Years since quitting: 1.2  . Smokeless tobacco: Never Used  Substance and Sexual Activity  . Alcohol use: Not on file  . Drug use: Not on file  . Sexual activity: Not on file  Other Topics Concern  . Not on file  Social History Narrative  . Not on file   Social Determinants of Health   Financial Resource Strain:   . Difficulty of Paying Living Expenses: Not on file  Food Insecurity:   . Worried About Charity fundraiser in the Last Year: Not on file  . Ran Out of Food in the Last Year: Not on file  Transportation Needs:   . Lack of Transportation (Medical): Not on file  . Lack of Transportation (Non-Medical):  Not on file  Physical Activity:   . Days of Exercise per Week: Not on file  . Minutes of Exercise per Session: Not on file  Stress:   . Feeling of Stress : Not on file  Social Connections:   . Frequency of Communication with Friends and Family: Not on file  . Frequency of Social Gatherings with Friends and Family: Not on file  . Attends Religious Services: Not on file  . Active Member of Clubs or Organizations: Not on file  . Attends Archivist Meetings: Not on file  . Marital Status: Not on file  Intimate Partner Violence:   . Fear of Current or Ex-Partner: Not on file  . Emotionally Abused: Not on file  . Physically Abused: Not on file  . Sexually Abused: Not on file    Family History  Problem Relation Age of Onset  . Cancer  Mother   . Pulmonary fibrosis Mother   . Diabetes Brother   . Heart disease Father     Past Surgical History:  Procedure Laterality Date  . CHOLECYSTECTOMY    . FRACTURE SURGERY    . LEFT HEART CATH AND CORONARY ANGIOGRAPHY N/A 09/30/2019   Procedure: LEFT HEART CATH AND CORONARY ANGIOGRAPHY;  Surgeon: Burnell Blanks, MD;  Location: [facility];  Service: Cardiovascular;  Laterality: N/A;  . pulmonary fibrosis      ROS: Review of Systems Negative except as stated above  PHYSICAL EXAM: BP 110/60   Pulse (!) 58   Resp 16   Wt 215 lb 3.2 oz (97.6 kg)   SpO2 95%   BMI 31.78 kg/m   Wt Readings from Last 3 Encounters:  01/12/20 215 lb 3.2 oz (97.6 kg)  11/17/19 210 lb (95.3 kg)  09/30/19 205 lb (93 kg)    Physical Exam General appearance - alert, well appearing, older Caucasian male and in no distress Mental status - normal mood, behavior, speech, dress, motor activity, and thought processes Neck - supple, no significant adenopathy Chest - clear to auscultation, no wheezes, rales or rhonchi, symmetric air entry Heart - normal rate, regular rhythm, normal S1, S2, no murmurs, rubs, clicks or gallops Extremities - peripheral pulses normal, no pedal edema, no clubbing or cyanosis  CMP Latest Ref Rng & Units 09/28/2019 07/28/2019 07/16/2012  Glucose 65 - 99 mg/dL 99 86 98  BUN 8 - 27 mg/dL 12 11 8   Creatinine 0.76 - 1.27 mg/dL 0.92 1.08 0.87  Sodium 134 - 144 mmol/L 139 138 140  Potassium 3.5 - 5.2 mmol/L 4.8 4.9 4.0  Chloride 96 - 106 mmol/L 104 100 107  CO2 20 - 29 mmol/L 24 23 22   Calcium 8.6 - 10.2 mg/dL 9.4 9.4 9.2  Total Protein 6.0 - 8.5 g/dL - 7.6 7.0  Total Bilirubin 0.0 - 1.2 mg/dL - 0.7 0.9  Alkaline Phos 48 - 121 IU/L - 98 70  AST 0 - 40 IU/L - 17 20  ALT 0 - 44 IU/L - 18 16   Lipid Panel     Component Value Date/Time   CHOL 249 (H) 07/28/2019 1459   TRIG 139 07/28/2019 1459   HDL 36 (L) 07/28/2019 1459   CHOLHDL 6.9 (H) 07/28/2019 1459    CHOLHDL 5.8 07/16/2012 1359   VLDL 36 07/16/2012 1359   LDLCALC 187 (H) 07/28/2019 1459    CBC    Component Value Date/Time   WBC 9.5 09/28/2019 1412   WBC 11.1 (H) 07/16/2012 1359  RBC 4.87 09/28/2019 1412   RBC 5.12 07/16/2012 1359   HGB 14.8 09/28/2019 1412   HCT 43.7 09/28/2019 1412   PLT 217 09/28/2019 1412   MCV 90 09/28/2019 1412   MCH 30.4 09/28/2019 1412   MCH 30.7 07/16/2012 1359   MCHC 33.9 09/28/2019 1412   MCHC 34.7 07/16/2012 1359   RDW 15.1 09/28/2019 1412   LYMPHSABS 2.4 07/16/2012 1359   MONOABS 0.6 07/16/2012 1359   EOSABS 0.1 07/16/2012 1359   BASOSABS 0.0 07/16/2012 1359    ASSESSMENT AND PLAN:  1. Coronary artery disease of native artery of native heart with stable angina pectoris (HCC) Stable.  His pulse is in the 50s so we do not have room to increase the beta-blocker.  I have prescribed some sublingual nitroglycerin and went over with him how to use it.  Advised that whenever he has to put 1 under the tongue that he be sit down to do so and remain sitting for at least 5 to 10 minutes.  Keep follow-up appointment with cardiologist.  Continue isosorbide, metoprolol, Crestor and aspirin - nitroGLYCERIN (NITROSTAT) 0.4 MG SL tablet; Place 1 tablet (0.4 mg total) under the tongue every 5 (five) minutes as needed for chest pain (if pain persist afer 3 doses, call EMS).  Dispense: 30 tablet; Refill: 3  2. Obesity (BMI 30.0-34.9) Discussed the importance of healthy eating habits.  Printed information given.  3. Influenza vaccine refused Recommended.  Patient declined.    Patient was given the opportunity to ask questions.  Patient verbalized understanding of the plan and was able to repeat key elements of the plan.   No orders of the defined types were placed in this encounter.    Requested Prescriptions   Signed Prescriptions Disp Refills  . nitroGLYCERIN (NITROSTAT) 0.4 MG SL tablet 30 tablet 3    Sig: Place 1 tablet (0.4 mg total) under the  tongue every 5 (five) minutes as needed for chest pain (if pain persist afer 3 doses, call EMS).    Return in about 4 months (around 05/11/2020).  Karle Plumber, MD, FACP

## 2020-02-10 ENCOUNTER — Ambulatory Visit: Payer: Medicare Other | Admitting: Cardiology

## 2020-02-10 ENCOUNTER — Other Ambulatory Visit: Payer: Self-pay

## 2020-02-10 ENCOUNTER — Encounter: Payer: Self-pay | Admitting: Cardiology

## 2020-02-10 VITALS — BP 100/60 | HR 65 | Ht 69.0 in | Wt 208.0 lb

## 2020-02-10 DIAGNOSIS — I2510 Atherosclerotic heart disease of native coronary artery without angina pectoris: Secondary | ICD-10-CM | POA: Diagnosis not present

## 2020-02-10 DIAGNOSIS — Z79899 Other long term (current) drug therapy: Secondary | ICD-10-CM

## 2020-02-10 NOTE — Progress Notes (Signed)
Cardiology Office Note:    Date:  02/10/2020   ID:  Casey James, DOB 26-May-1955, MRN [id number]  PCP:  Ladell Pier, MD  [facility] Cardiologist:  Candee Furbish, MD  [facility] Electrophysiologist:  None   Referring MD: Ladell Pier, MD     History of Present Illness:    Casey James is a 64 y.o. male with exertional angina, coronary artery disease, CTO of RCA here for follow-up.  The first day he tried his isosorbide and metoprolol, he felt faint, felt sweat, ended up actually having a syncopal episode.  The next day he took it, he felt better.  Had a headache for a few days but this went away.  His blood pressure today was 100/60.  We decided to stop his amlodipine 5 mg.  He just remodeled did some flooring in his house.  Heavy exertional activity and did not suffer from a tremendous amount of chest pain.  He does remember an episode however where he was helping his wife with the groceries and did have to sit down and take some deep breaths to allow his chest pain to subside.  Past Medical History:  Diagnosis Date  . Chest pain   . Emphysema of lung ([place])   . Former smoker   . GERD (gastroesophageal reflux disease)     Past Surgical History:  Procedure Laterality Date  . CHOLECYSTECTOMY    . FRACTURE SURGERY    . LEFT HEART CATH AND CORONARY ANGIOGRAPHY N/A 09/30/2019   Procedure: LEFT HEART CATH AND CORONARY ANGIOGRAPHY;  Surgeon: Burnell Blanks, MD;  Location: [facility];  Service: Cardiovascular;  Laterality: N/A;  . pulmonary fibrosis      Current Medications: Current Meds  Medication Sig  . aspirin EC 81 MG tablet Take 81 mg by mouth daily.  . isosorbide mononitrate (IMDUR) 30 MG 24 hr tablet Take 1 tablet (30 mg total) by mouth daily.  . Metoprolol Succinate 25 MG CS24 Take 25 mg by mouth daily.  . Multiple Vitamins-Minerals (CENTRUM) tablet Take 1 tablet by mouth daily.   . nitroGLYCERIN (NITROSTAT) 0.4 MG SL tablet Place 1  tablet (0.4 mg total) under the tongue every 5 (five) minutes as needed for chest pain (if pain persist afer 3 doses, call EMS).  . pantoprazole (PROTONIX) 40 MG tablet Take 1 tablet by mouth once daily  . rosuvastatin (CRESTOR) 20 MG tablet Take 1 tablet (20 mg total) by mouth daily.  . [DISCONTINUED] amLODipine (NORVASC) 5 MG tablet Take 1 tablet by mouth once daily     Allergies:   Patient has no known allergies.   Social History   Socioeconomic History  . Marital status: Married    Spouse name: Not on file  . Number of children: Not on file  . Years of education: Not on file  . Highest education level: Not on file  Occupational History  . Not on file  Tobacco Use  . Smoking status: Former Smoker    Years: 50.00    Quit date: 10/21/2018    Years since quitting: 1.3  . Smokeless tobacco: Never Used  Substance and Sexual Activity  . Alcohol use: Not on file  . Drug use: Not on file  . Sexual activity: Not on file  Other Topics Concern  . Not on file  Social History Narrative  . Not on file   Social Determinants of Health   Financial Resource Strain: Not on file  Food  Insecurity: Not on file  Transportation Needs: Not on file  Physical Activity: Not on file  Stress: Not on file  Social Connections: Not on file     Family History: The patient's family history includes Cancer in his mother; Diabetes in his brother; Heart disease in his father; Pulmonary fibrosis in his mother.  ROS:   Please see the history of present illness.     All other systems reviewed and are negative.  EKGs/Labs/Other Studies Reviewed:    The following studies were reviewed today:  Cath 09/30/19   Prox RCA lesion is 100% stenosed.  Ost Cx to Prox Cx lesion is 50% stenosed.  1st Mrg lesion is 40% stenosed.  Prox LAD to Mid LAD lesion is 30% stenosed.  Dist LAD lesion is 30% stenosed.  1st Diag lesion is 40% stenosed.  The left ventricular systolic function is normal.  LV end  diastolic pressure is normal.  The left ventricular ejection fraction is greater than 65% by visual estimate.  There is no mitral valve regurgitation.   1. Chronic total occlusion of the proximal RCA. The mid and distal RCA fills briskly from left to right collaterals. The RCA is a large dominant vessel. Collaterals supplied by the LAD 2. There is mild to moderate non-obstructive plaque in the proximal Circumflex and high first obtuse marginal branch. This is a moderate caliber non-dominant vessel.  3. The LAD has mild plaque in the proximal and mid vessel.  4. Normal LV systolic function  Recommendations: Medical management of CAD. The collaterals to the chronically occluded RCA arise from the LAD. The disease in the proximal Circumflex does not appear to be obstructive. If he has breakthrough angina that cannot be controlled with medical therapy, could consider review of the cath films by the CTO team to discuss options for PCI of the RCA.   Diagnostic Dominance: Right      EKG: 09/28/2019-sinus rhythm no ischemic changes  Recent Labs: 07/28/2019: ALT 18 09/28/2019: BUN 12; Creatinine, Ser 0.92; Hemoglobin 14.8; Platelets 217; Potassium 4.8; Sodium 139  Recent Lipid Panel    Component Value Date/Time   CHOL 249 (H) 07/28/2019 1459   TRIG 139 07/28/2019 1459   HDL 36 (L) 07/28/2019 1459   CHOLHDL 6.9 (H) 07/28/2019 1459   CHOLHDL 5.8 07/16/2012 1359   VLDL 36 07/16/2012 1359   LDLCALC 187 (H) 07/28/2019 1459     Risk Assessment/Calculations:       Physical Exam:    VS:  BP 100/60 (BP Location: Left Arm, Patient Position: Sitting, Cuff Size: Normal)   Pulse 65   Ht 5\' 9"  (1.753 m)   Wt 208 lb (94.3 kg)   SpO2 96%   BMI 30.72 kg/m     Wt Readings from Last 3 Encounters:  02/10/20 208 lb (94.3 kg)  01/12/20 215 lb 3.2 oz (97.6 kg)  11/17/19 210 lb (95.3 kg)     GEN:  Well nourished, well developed in no acute distress HEENT: Normal NECK: No JVD; No carotid  bruits LYMPHATICS: No lymphadenopathy CARDIAC: RRR, no murmurs, rubs, gallops RESPIRATORY:  Clear to auscultation without rales, wheezing or rhonchi  ABDOMEN: Soft, non-tender, non-distended MUSCULOSKELETAL:  No edema; No deformity  SKIN: Warm and dry NEUROLOGIC:  Alert and oriented x 3 PSYCHIATRIC:  Normal affect   ASSESSMENT:    1. Coronary artery disease involving native coronary artery of native heart without angina pectoris   2. Medication management    PLAN:    In order  of problems listed above:  Exertional angina with coronary artery disease. -Occluded RCA on cardiac catheterization. --CTO team consult.  I would like for him to sit down, review his catheterization films to decide whether or not it makes sense to approach from a CTO perspective. --Stopping amlodipine, blood pressure too low.  Actually had an episode of syncope, likely in part vagally mediated as well. --Trying to continue with isosorbide and low-dose metoprolol.  Hyperlipidemia -LDL 187 previously.  Increased his Crestor to 20 mg about 3 months ago.  Checking lipids today.       Medication Adjustments/Labs and Tests Ordered: Current medicines are reviewed at length with the patient today.  Concerns regarding medicines are outlined above.  Orders Placed This Encounter  Procedures  . Lipid panel   No orders of the defined types were placed in this encounter.   Patient Instructions  Medication Instructions:  Stop amlodipine. Continue all other medications as listed.  *If you need a refill on your cardiac medications before your next appointment, please call your pharmacy*  Lab: Please have blood work today (Lipid)  You have been referred to see Dr Casandra Doffing to discuss possible procedures for your right coronary artery occlusion.  Follow-Up: At [facility], you and your health needs are our priority.  As part of our continuing mission to provide you with exceptional heart care, we have  created designated Provider Care Teams.  These Care Teams include your primary Cardiologist (physician) and Advanced Practice Providers (APPs -  Physician Assistants and Nurse Practitioners) who all work together to provide you with the care you need, when you need it.  We recommend signing up for the patient portal called "MyChart".  Sign up information is provided on this After Visit Summary.  MyChart is used to connect with patients for Virtual Visits (Telemedicine).  Patients are able to view lab/test results, encounter notes, upcoming appointments, etc.  Non-urgent messages can be sent to your provider as well.   To learn more about what you can do with MyChart, go to [email or web address].    Your next appointment:   3 month(s)  The format for your next appointment:   In Person  Provider:   Candee Furbish, MD   Thank you for choosing [facility]!!         Signed, Candee Furbish, MD  02/10/2020 3:35 PM    [place]

## 2020-02-10 NOTE — Patient Instructions (Signed)
Medication Instructions:  Stop amlodipine. Continue all other medications as listed.  *If you need a refill on your cardiac medications before your next appointment, please call your pharmacy*  Lab: Please have blood work today (Lipid)  You have been referred to see Dr Casandra Doffing to discuss possible procedures for your right coronary artery occlusion.  Follow-Up: At [facility], you and your health needs are our priority.  As part of our continuing mission to provide you with exceptional heart care, we have created designated Provider Care Teams.  These Care Teams include your primary Cardiologist (physician) and Advanced Practice Providers (APPs -  Physician Assistants and Nurse Practitioners) who all work together to provide you with the care you need, when you need it.  We recommend signing up for the patient portal called "MyChart".  Sign up information is provided on this After Visit Summary.  MyChart is used to connect with patients for Virtual Visits (Telemedicine).  Patients are able to view lab/test results, encounter notes, upcoming appointments, etc.  Non-urgent messages can be sent to your provider as well.   To learn more about what you can do with MyChart, go to [email or web address].    Your next appointment:   3 month(s)  The format for your next appointment:   In Person  Provider:   Candee Furbish, MD   Thank you for choosing [facility]!!

## 2020-02-11 LAB — LIPID PANEL
Chol/HDL Ratio: 3.8 ratio (ref 0.0–5.0)
Cholesterol, Total: 130 mg/dL (ref 100–199)
HDL: 34 mg/dL — ABNORMAL LOW (ref >39)
LDL Chol Calc (NIH): 74 mg/dL (ref 0–99)
Triglycerides: 121 mg/dL (ref 0–149)
VLDL Cholesterol Cal: 22 mg/dL (ref 5–40)

## 2020-02-16 NOTE — Progress Notes (Signed)
Cardiology Office Note   Date:  02/17/2020   ID:  Casey James, DOB 03-05-1955, MRN [id number]  PCP:  Ladell Pier, MD    No chief complaint on file.  CAD  Wt Readings from Last 3 Encounters:  02/17/20 210 lb 9.6 oz (95.5 kg)  02/10/20 208 lb (94.3 kg)  01/12/20 215 lb 3.2 oz (97.6 kg)       History of Present Illness: Casey James is a 64 y.o. male  With CTO of RCA on cath in 8/21.  He had chest pains starting in 2020.  Saw an MD in June 2021 for chest pain triggered by strenuous activity.  He is here today for interventional cardiology consult regarding RCA CTO.   He had a cath in 8/21 and was started on meds after that.  He feels that his chest pain is much improved.  He has not needed to use SL NTG.  He did not tolerate Imdur initially but this has improved.  He is taking it without side effects.    In the last few weeks, he has been remodeling a house without angina sx.  Once a week or so, he may do something where he feel ssome mild chest discomfort that resolves with a few minutes of rest.  He has not used SL NTG.     Past Medical History:  Diagnosis Date  . Chest pain   . Emphysema of lung ([place])   . Former smoker   . GERD (gastroesophageal reflux disease)     Past Surgical History:  Procedure Laterality Date  . CHOLECYSTECTOMY    . FRACTURE SURGERY    . LEFT HEART CATH AND CORONARY ANGIOGRAPHY N/A 09/30/2019   Procedure: LEFT HEART CATH AND CORONARY ANGIOGRAPHY;  Surgeon: Burnell Blanks, MD;  Location: [place] [facility];  Service: Cardiovascular;  Laterality: N/A;  . pulmonary fibrosis       Current Outpatient Medications  Medication Sig Dispense Refill  . aspirin EC 81 MG tablet Take 81 mg by mouth daily.    . isosorbide mononitrate (IMDUR) 30 MG 24 hr tablet Take 1 tablet (30 mg total) by mouth daily. 90 tablet 3  . Metoprolol Succinate 25 MG CS24 Take 25 mg by mouth daily. 90 capsule 3  . Multiple Vitamins-Minerals (CENTRUM)  tablet Take 1 tablet by mouth daily.     . nitroGLYCERIN (NITROSTAT) 0.4 MG SL tablet Place 1 tablet (0.4 mg total) under the tongue every 5 (five) minutes as needed for chest pain (if pain persist afer 3 doses, call EMS). 30 tablet 3  . pantoprazole (PROTONIX) 40 MG tablet Take 1 tablet by mouth once daily 90 tablet 3  . rosuvastatin (CRESTOR) 20 MG tablet Take 1 tablet (20 mg total) by mouth daily. 90 tablet 3   No current facility-administered medications for this visit.    Allergies:   Patient has no known allergies.    Social History:  The patient  reports that he quit smoking about 15 months ago. He quit after 50.00 years of use. He has never used smokeless tobacco.   Family History:  The patient's family history includes Cancer in his mother; Diabetes in his brother; Heart disease in his father; Pulmonary fibrosis in his mother.    ROS:  Please see the history of present illness.   Otherwise, review of systems are positive for rare chest pain.   All other systems are reviewed and negative.    PHYSICAL EXAM: VS:  BP 130/70   Pulse 67   Ht 5\' 9"  (1.753 m)   Wt 210 lb 9.6 oz (95.5 kg)   SpO2 96%   BMI 31.10 kg/m  , BMI Body mass index is 31.1 kg/m. GEN: Well nourished, well developed, in no acute distress  HEENT: normal  Neck: no JVD, carotid bruits, or masses Cardiac: RRR; no murmurs, rubs, or gallops,no edema  Respiratory:  clear to auscultation bilaterally, normal work of breathing GI: soft, nontender, nondistended, + BS MS: no deformity or atrophy  Skin: warm and dry, no rash Neuro:  Strength and sensation are intact Psych: euthymic mood, full affect    Recent Labs: 07/28/2019: ALT 18 09/28/2019: BUN 12; Creatinine, Ser 0.92; Hemoglobin 14.8; Platelets 217; Potassium 4.8; Sodium 139   Lipid Panel    Component Value Date/Time   CHOL 130 02/10/2020 1528   TRIG 121 02/10/2020 1528   HDL 34 (L) 02/10/2020 1528   CHOLHDL 3.8 02/10/2020 1528   CHOLHDL 5.8 07/16/2012  1359   VLDL 36 07/16/2012 1359   LDLCALC 74 02/10/2020 1528     Other studies Reviewed: Additional studies/ records that were reviewed today with results demonstrating: cath films reviewed with the patient.   ASSESSMENT AND PLAN:  1. CAD: I personally reviewed the cath films.  He has a heavily calcified, chronically occluded mid RCA.  It is a long occlusion as well, which along with the calcium, make successful PCI less likely.  He does have brisk left to right collaterals.  There are several large branches in the distal RCA territory with the occlusion extending to a large bifurcation.  Angina is much improved at this time.  He has manageable sx and remains active.  I gave him the option of pretreating with SL NTG if he is going to do a strenuous activity.  Other options include going up to Imdur 45 or 60 mg.  Overall, he feels he is doing well and will continue with the current plan.  I did explain the risks and benefits of CTO intervention.   2. Hyperlipidemia: LDL 74, TG 121.  COntinue high dose statin.  3. HTN: The current medical regimen is effective;  continue present plan and medications.  Amlodipine was recently stopped.  4. Former smoker: Quit in 2020.     Current medicines are reviewed at length with the patient today.  The patient concerns regarding his medicines were addressed.  The following changes have been made:  No change  Labs/ tests ordered today include:  No orders of the defined types were placed in this encounter.   Recommend 150 minutes/week of aerobic exercise Low fat, low carb, high fiber diet recommended  Disposition:   FU with Dr. 2021   Signed, Anne Fu, MD  02/17/2020 4:59 PM    [facility] [address] [place], [place], [place]  [place] Phone: [phone]; Fax: [phone]

## 2020-02-17 ENCOUNTER — Other Ambulatory Visit: Payer: Self-pay

## 2020-02-17 ENCOUNTER — Encounter: Payer: Self-pay | Admitting: Interventional Cardiology

## 2020-02-17 ENCOUNTER — Ambulatory Visit: Payer: Medicare Other | Admitting: Interventional Cardiology

## 2020-02-17 VITALS — BP 130/70 | HR 67 | Ht 69.0 in | Wt 210.6 lb

## 2020-02-17 DIAGNOSIS — I10 Essential (primary) hypertension: Secondary | ICD-10-CM | POA: Diagnosis not present

## 2020-02-17 DIAGNOSIS — E782 Mixed hyperlipidemia: Secondary | ICD-10-CM

## 2020-02-17 DIAGNOSIS — I25118 Atherosclerotic heart disease of native coronary artery with other forms of angina pectoris: Secondary | ICD-10-CM

## 2020-02-17 DIAGNOSIS — Z87891 Personal history of nicotine dependence: Secondary | ICD-10-CM | POA: Diagnosis not present

## 2020-02-17 NOTE — Patient Instructions (Addendum)
Medication Instructions:  Your physician recommends that you continue on your current medications as directed. Please refer to the Current Medication list given to you today.  *If you need a refill on your cardiac medications before your next appointment, please call your pharmacy*   Lab Work: None today If you have labs (blood work) drawn today and your tests are completely normal, you will receive your results only by: Marland Kitchen MyChart Message (if you have MyChart) OR . A paper copy in the mail If you have any lab test that is abnormal or we need to change your treatment, we will call you to review the results.   Testing/Procedures: None today   Follow-Up: At [facility], you and your health needs are our priority.  As part of our continuing mission to provide you with exceptional heart care, we have created designated Provider Care Teams.  These Care Teams include your primary Cardiologist (physician) and Advanced Practice Providers (APPs -  Physician Assistants and Nurse Practitioners) who all work together to provide you with the care you need, when you need it.  We recommend signing up for the patient portal called "MyChart".  Sign up information is provided on this After Visit Summary.  MyChart is used to connect with patients for Virtual Visits (Telemedicine).  Patients are able to view lab/test results, encounter notes, upcoming appointments, etc.  Non-urgent messages can be sent to your provider as well.   To learn more about what you can do with MyChart, go to [email or web address].    Follow up with Dr. Irish Lack as needed  Place one nitro tablet under the tongue prior to strenuous activity to prevent chest pain.

## 2020-05-12 ENCOUNTER — Telehealth: Payer: Medicare Other | Admitting: Internal Medicine

## 2020-05-24 ENCOUNTER — Ambulatory Visit: Payer: Medicare Other | Admitting: Cardiology

## 2020-06-01 ENCOUNTER — Other Ambulatory Visit: Payer: Self-pay

## 2020-06-01 ENCOUNTER — Encounter: Payer: Self-pay | Admitting: Cardiology

## 2020-06-01 ENCOUNTER — Ambulatory Visit: Payer: Medicare Other | Admitting: Cardiology

## 2020-06-01 VITALS — BP 120/70 | HR 66 | Ht 69.0 in | Wt 217.0 lb

## 2020-06-01 DIAGNOSIS — I209 Angina pectoris, unspecified: Secondary | ICD-10-CM

## 2020-06-01 DIAGNOSIS — I10 Essential (primary) hypertension: Secondary | ICD-10-CM | POA: Diagnosis not present

## 2020-06-01 DIAGNOSIS — I25118 Atherosclerotic heart disease of native coronary artery with other forms of angina pectoris: Secondary | ICD-10-CM

## 2020-06-01 NOTE — Patient Instructions (Signed)

## 2020-06-01 NOTE — Progress Notes (Signed)
Cardiology Office Note:    Date:  06/01/2020   ID:  [name], DOB Sep 08, 1955, MRN [id number]  PCP:  Ladell Pier, MD   [place]  Cardiologist:  Candee Furbish, MD  Advanced Practice Provider:  No care team member to display Electrophysiologist:  None       Referring MD: Ladell Pier, MD     History of Present Illness:    Casey James is a 65 y.o. male here for the follow-up of coronary artery disease.  Previously seen by Dr. Irish Lack on 02/17/2020 with CTO of RCA on cath.  Chest pain began in 2020.  He had an episode where it was triggered by strenuous activity.  He was started on medications and his angina was much improved.  He did not tolerate the isosorbide initially but this seemed to improve.  Taking it now without much side effects.  He has been remodeling a house without any anginal symptoms.  If he does have chest discomfort, its mild and resolves in a few minutes.  His father died in his 78s of heart disease.  Past Medical History:  Diagnosis Date  . Chest pain   . Emphysema of lung ([place])   . Former smoker   . GERD (gastroesophageal reflux disease)     Past Surgical History:  Procedure Laterality Date  . CHOLECYSTECTOMY    . FRACTURE SURGERY    . LEFT HEART CATH AND CORONARY ANGIOGRAPHY N/A 09/30/2019   Procedure: LEFT HEART CATH AND CORONARY ANGIOGRAPHY;  Surgeon: Burnell Blanks, MD;  Location: [place] [facility];  Service: Cardiovascular;  Laterality: N/A;  . pulmonary fibrosis      Current Medications: Current Meds  Medication Sig  . aspirin EC 81 MG tablet Take 81 mg by mouth daily.  . isosorbide mononitrate (IMDUR) 30 MG 24 hr tablet Take 1 tablet (30 mg total) by mouth daily.  . Metoprolol Succinate 25 MG CS24 Take 25 mg by mouth daily.  . Multiple Vitamins-Minerals (CENTRUM) tablet Take 1 tablet by mouth daily.   . nitroGLYCERIN (NITROSTAT) 0.4 MG SL tablet Place 1 tablet (0.4 mg total) under the  tongue every 5 (five) minutes as needed for chest pain (if pain persist afer 3 doses, call EMS).  . pantoprazole (PROTONIX) 40 MG tablet Take 1 tablet by mouth once daily  . rosuvastatin (CRESTOR) 20 MG tablet Take 1 tablet (20 mg total) by mouth daily.     Allergies:   Patient has no known allergies.   Social History   Socioeconomic History  . Marital status: Married    Spouse name: Not on file  . Number of children: Not on file  . Years of education: Not on file  . Highest education level: Not on file  Occupational History  . Not on file  Tobacco Use  . Smoking status: Former Smoker    Years: 50.00    Quit date: 10/21/2018    Years since quitting: 1.6  . Smokeless tobacco: Never Used  Substance and Sexual Activity  . Alcohol use: Not on file  . Drug use: Not on file  . Sexual activity: Not on file  Other Topics Concern  . Not on file  Social History Narrative  . Not on file   Social Determinants of Health   Financial Resource Strain: Not on file  Food Insecurity: Not on file  Transportation Needs: Not on file  Physical Activity: Not on file  Stress: Not on  file  Social Connections: Not on file     Family History: The patient's family history includes Cancer in his mother; Diabetes in his brother; Heart disease in his father; Pulmonary fibrosis in his mother.  ROS:   Please see the history of present illness.     All other systems reviewed and are negative.  EKGs/Labs/Other Studies Reviewed:    The following studies were reviewed today: Prior cardiac catheterization reviewed.  CTO RCA.   Recent Labs: 07/28/2019: ALT 18 09/28/2019: BUN 12; Creatinine, Ser 0.92; Hemoglobin 14.8; Platelets 217; Potassium 4.8; Sodium 139  Recent Lipid Panel    Component Value Date/Time   CHOL 130 02/10/2020 1528   TRIG 121 02/10/2020 1528   HDL 34 (L) 02/10/2020 1528   CHOLHDL 3.8 02/10/2020 1528   CHOLHDL 5.8 07/16/2012 1359   VLDL 36 07/16/2012 1359   LDLCALC 74 02/10/2020  1528     Risk Assessment/Calculations:      Physical Exam:    VS:  BP 120/70 (BP Location: Left Arm, Patient Position: Sitting, Cuff Size: Normal)   Pulse 66   Ht 5\' 9"  (1.753 m)   Wt 217 lb (98.4 kg)   SpO2 97%   BMI 32.05 kg/m     Wt Readings from Last 3 Encounters:  06/01/20 217 lb (98.4 kg)  02/17/20 210 lb 9.6 oz (95.5 kg)  02/10/20 208 lb (94.3 kg)     GEN:  Well nourished, well developed in no acute distress HEENT: Normal NECK: No JVD; No carotid bruits LYMPHATICS: No lymphadenopathy CARDIAC: RRR, no murmurs, rubs, gallops RESPIRATORY:  Clear to auscultation without rales, wheezing or rhonchi  ABDOMEN: Soft, non-tender, non-distended MUSCULOSKELETAL:  No edema; No deformity  SKIN: Warm and dry NEUROLOGIC:  Alert and oriented x 3 PSYCHIATRIC:  Normal affect   ASSESSMENT:    1. Coronary artery disease involving native coronary artery of native heart with other form of angina pectoris ([place])   2. Angina pectoris ([place])   3. Essential hypertension    PLAN:    In order of problems listed above:  Coronary artery disease with CTO of RCA -Long lesion calcified which could make successful PCI less likely.  Appreciate Dr. Irish Lack reviewing.  Brisk left to right Collaterals.  There are also several large branches in the distal RCA territory with occlusion extending to a large bifurcation.  Since his angina was much improved and managed with isosorbide, plan was to continue with medical management.  Hyperlipidemia -Prior LDL 74 triglycerides 121 continue with high-dose statin  Hypertension -Amlodipine recently stopped.  Continue with current medications, effective.  Former smoker -Quit in 2020.  Excellent.  Says he drank and smoked for about 50 years.  Family history of coronary artery disease -Father died in his 90s from heart disease.  His brother in his 47s has pulmonary fibrosis and 6 months to live he states.  His mother died of the same thing.    6  mths  Medication Adjustments/Labs and Tests Ordered: Current medicines are reviewed at length with the patient today.  Concerns regarding medicines are outlined above.  No orders of the defined types were placed in this encounter.  No orders of the defined types were placed in this encounter.   Patient Instructions  Medication Instructions:  The current medical regimen is effective;  continue present plan and medications.  *If you need a refill on your cardiac medications before your next appointment, please call your pharmacy*  Follow-Up: At [facility], you and your  health needs are our priority.  As part of our continuing mission to provide you with exceptional heart care, we have created designated Provider Care Teams.  These Care Teams include your primary Cardiologist (physician) and Advanced Practice Providers (APPs -  Physician Assistants and Nurse Practitioners) who all work together to provide you with the care you need, when you need it.  We recommend signing up for the patient portal called "MyChart".  Sign up information is provided on this After Visit Summary.  MyChart is used to connect with patients for Virtual Visits (Telemedicine).  Patients are able to view lab/test results, encounter notes, upcoming appointments, etc.  Non-urgent messages can be sent to your provider as well.   To learn more about what you can do with MyChart, go to [email or web address].    Your next appointment:   6 month(s)  The format for your next appointment:   In Person  Provider:   Candee Furbish, MD   Thank you for choosing [facility]!!        Signed, Candee Furbish, MD  06/01/2020 9:18 AM    [place]

## 2020-06-16 ENCOUNTER — Other Ambulatory Visit: Payer: Self-pay

## 2020-06-16 ENCOUNTER — Ambulatory Visit: Payer: Medicare Other | Attending: Internal Medicine | Admitting: Internal Medicine

## 2020-06-16 DIAGNOSIS — I25118 Atherosclerotic heart disease of native coronary artery with other forms of angina pectoris: Secondary | ICD-10-CM | POA: Diagnosis not present

## 2020-06-16 DIAGNOSIS — E669 Obesity, unspecified: Secondary | ICD-10-CM | POA: Diagnosis not present

## 2020-06-16 DIAGNOSIS — I10 Essential (primary) hypertension: Secondary | ICD-10-CM | POA: Diagnosis not present

## 2020-06-16 DIAGNOSIS — G5603 Carpal tunnel syndrome, bilateral upper limbs: Secondary | ICD-10-CM | POA: Diagnosis not present

## 2020-06-16 NOTE — Progress Notes (Signed)
Virtual Visit via Telephone Note  I connected with Casey James on 06/16/2020 at 2:21 p.m by telephone and verified that I am speaking with the correct person using two identifiers  Location: Patient: home Provider: office  Participants: Myself Patient RN: Leighton Parody [facility] interpreter:   I discussed the limitations, risks, security and privacy concerns of performing an evaluation and management service by telephone and the availability of in person appointments. I also discussed with the patient that there may be a patient responsible charge related to this service. The patient expressed understanding and agreed to proceed.   History of Present Illness: GERD, former smoker, HL, HTN, obesity, marijuana use, CAD.  Last seen 12/2019  C/o intermittent numbness in the hands involving the thumb, index and middle fingers x2 months.  Mainly occurs at nights and wakes him up from sleep.  He sleeps on his side with his hand folded below his pillow.  Which ever hand is folded is the one that usually wakes him up with the tingling and numbness.  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  HTN/CAD:  Saw Dr. Marlou Porch earlier this mth. still gets CP every once and a while but has not use SL Nitro No SOB/LE edema Compliant with meds and salt restriction  Obesity:  Eating more fruits and veggies, less sweets.  Drinks water, coffee and one soda in the evenings More active around his yard.  When he saw Dr.Skains, wgh was 217 lbs; down now to 210 lbs   Outpatient Encounter Medications as of 06/16/2020  Medication Sig  . aspirin EC 81 MG tablet Take 81 mg by mouth daily.  . isosorbide mononitrate (IMDUR) 30 MG 24 hr tablet Take 1 tablet (30 mg total) by mouth daily.  . Metoprolol Succinate 25 MG CS24 Take 25 mg by mouth daily.  . Multiple Vitamins-Minerals (CENTRUM) tablet Take 1 tablet by mouth daily.   . nitroGLYCERIN (NITROSTAT) 0.4 MG SL  tablet Place 1 tablet (0.4 mg total) under the tongue every 5 (five) minutes as needed for chest pain (if pain persist afer 3 doses, call EMS).  . pantoprazole (PROTONIX) 40 MG tablet Take 1 tablet by mouth once daily  . rosuvastatin (CRESTOR) 20 MG tablet Take 1 tablet (20 mg total) by mouth daily.   No facility-administered encounter medications on file as of 06/16/2020.      Observations/Objective: Lab Results  Component Value Date   CHOL 130 02/10/2020   HDL 34 (L) 02/10/2020   LDLCALC 74 02/10/2020   TRIG 121 02/10/2020   CHOLHDL 3.8 02/10/2020     Chemistry      Component Value Date/Time   NA 139 09/28/2019 1412   K 4.8 09/28/2019 1412   CL 104 09/28/2019 1412   CO2 24 09/28/2019 1412   BUN 12 09/28/2019 1412   CREATININE 0.92 09/28/2019 1412   CREATININE 0.87 07/16/2012 1359      Component Value Date/Time   CALCIUM 9.4 09/28/2019 1412   ALKPHOS 98 07/28/2019 1459   AST 17 07/28/2019 1459   ALT 18 07/28/2019 1459   BILITOT 0.7 07/28/2019 1459       Assessment and Plan: 1. Carpal tunnel syndrome, bilateral History is suggestive of carpal tunnel syndrome.  I discussed this diagnosis with him including symptoms and management.  I recommend wearing a cock-up wrist splint at nights to help prevent flexion of the wrist causing compression of the median nerve.  I will leave a prescription for him at our front desk for him to get a pair of cock-up wrist splints from any medical supply store.  Hopefully his insurance will cover it  2. Essential hypertension Recent blood pressure reading with Dr. Marlou Porch was at goal.. Continue metoprolol  3. Coronary artery disease of native artery of native heart with stable angina pectoris ([place]) Stable and followed by cardiology.  Continue Crestor, metoprolol, isosorbide and aspirin  4. Obesity (BMI 30.0-34.9) Commended him on weight loss.  Discussed and encourage healthy eating habits.   Follow Up Instructions: 4 mths for Medicare  Wellness Visit   I discussed the assessment and treatment plan with the patient. The patient was provided an opportunity to ask questions and all were answered. The patient agreed with the plan and demonstrated an understanding of the instructions.   The patient was advised to call back or seek an in-person evaluation if the symptoms worsen or if the condition fails to improve as anticipated.  I  Spent 14 minutes on this telephone encounter  Karle Plumber, MD

## 2020-08-23 ENCOUNTER — Ambulatory Visit: Payer: Medicare Other | Admitting: Internal Medicine

## 2020-09-16 ENCOUNTER — Telehealth: Payer: Self-pay | Admitting: Cardiology

## 2020-09-16 NOTE — Telephone Encounter (Signed)
Spoke with pt who reports he was positive for Covid on 7/9.  His fever broke 7/13 but he is still having trouble with nasal congestion.  He is asking what kind of medication he can take.  Advised Coricidin OTC or saline nasal washes.  Advised if cough and head congestion continues to contact his PCP for further evaluation/treatment.  He states understanding and had no further questions at the time.

## 2020-09-16 NOTE — Telephone Encounter (Signed)
   Pt c/o medication issue:  1. Name of Medication: Decongestants  2. How are you currently taking this medication (dosage and times per day)?   3. Are you having a reaction (difficulty breathing--STAT)?   4. What is your medication issue? Pt said he had covid and until today he is still congested, he wanted to take meds but it always say to ask heart doctor since a lot of them may effect his heart meds, he wanted a medication he can take safely

## 2020-10-14 ENCOUNTER — Encounter: Payer: Self-pay | Admitting: Family

## 2020-10-14 ENCOUNTER — Ambulatory Visit: Payer: Medicare Other | Admitting: Internal Medicine

## 2020-10-14 ENCOUNTER — Ambulatory Visit: Payer: Medicare Other | Attending: Internal Medicine | Admitting: Family

## 2020-10-14 ENCOUNTER — Other Ambulatory Visit: Payer: Self-pay

## 2020-10-14 VITALS — BP 161/88 | HR 64 | Resp 20 | Ht 70.8 in | Wt 216.6 lb

## 2020-10-14 DIAGNOSIS — Z0000 Encounter for general adult medical examination without abnormal findings: Secondary | ICD-10-CM

## 2020-10-30 IMAGING — CT CT HEART MORP W/ CTA COR W/ SCORE W/ CA W/CM &/OR W/O CM
4 of 7 series · 8 of 20 positions shown, 9 images · IV contrast (APPLIED)
Comparison: None.
COMPARISON: None.

Addendum:
EXAM:
OVER-READ INTERPRETATION  CT CHEST

The following report is an over-read performed by radiologist Dr.
Foubert Mouloud [REDACTED] on 08/18/2019. This
over-read does not include interpretation of cardiac or coronary
anatomy or pathology. The coronary calcium score/coronary CTA
interpretation by the cardiologist is attached.
CLINICAL DATA: 64M former smoker with chest pain.
Cardiac/Coronary  CT
TECHNIQUE: The patient was scanned on a [employer] Force scanner.

[Series 6: best diast 73 % · axial · 0.39mm/px · z∈[+1383,+1432]mm · 2 of 368 slices shown, 3 images]
[im 123/368  vessel]
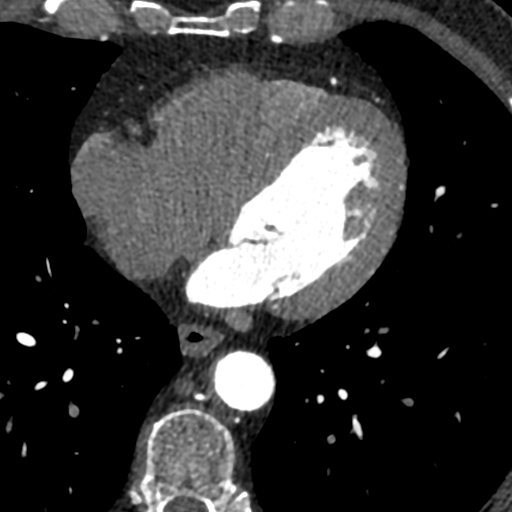
[im 123/368  lung]
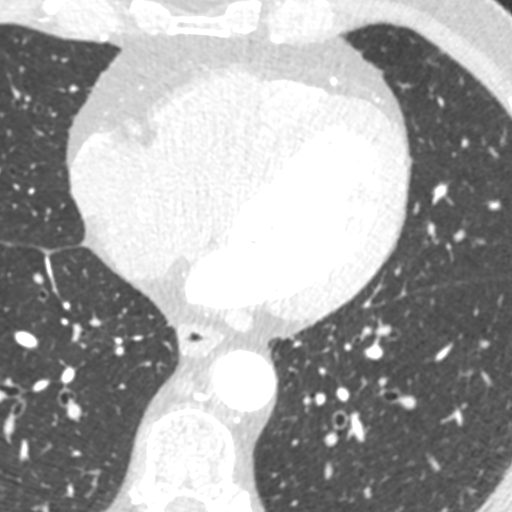
[im 245/368  vessel]
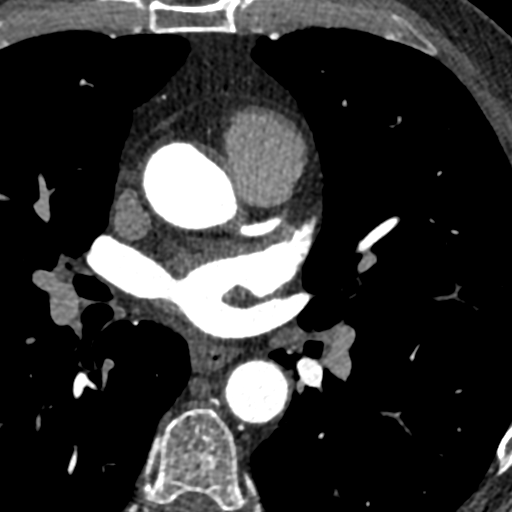

[Series 7: best syst 34 % · axial · 0.39mm/px · z∈[+1383,+1432]mm · 2 of 368 slices shown]
[im 123/368  vessel]
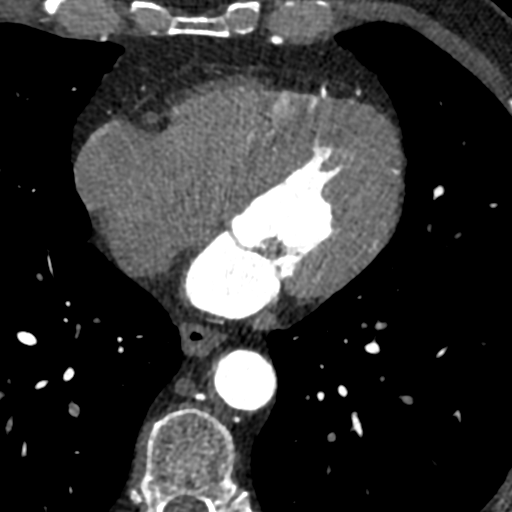
[im 245/368  vessel]
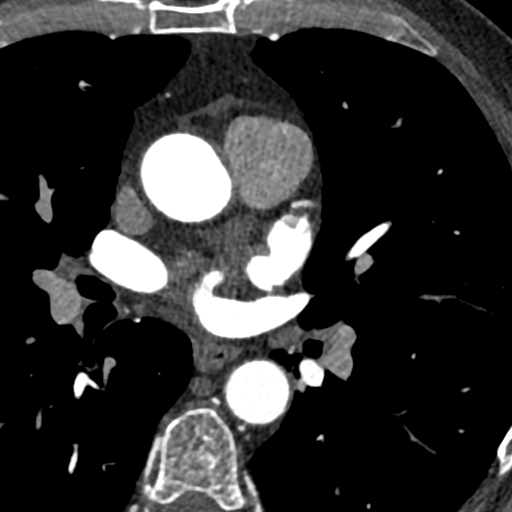

[Series 8: ts diast sharp 73 % · axial · 0.39mm/px · z∈[+1383,+1432]mm · 2 of 368 slices shown]
[im 123/368  lung]
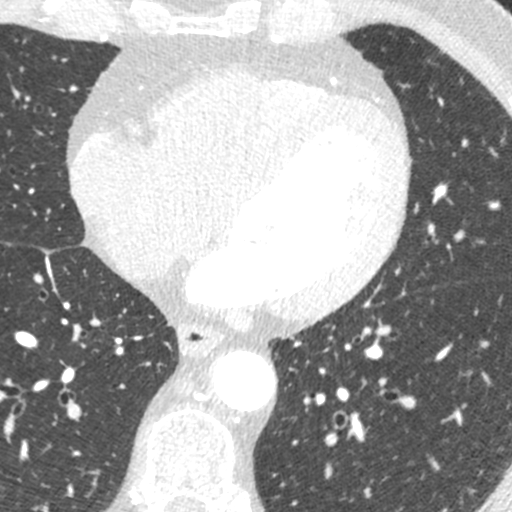
[im 245/368  lung]
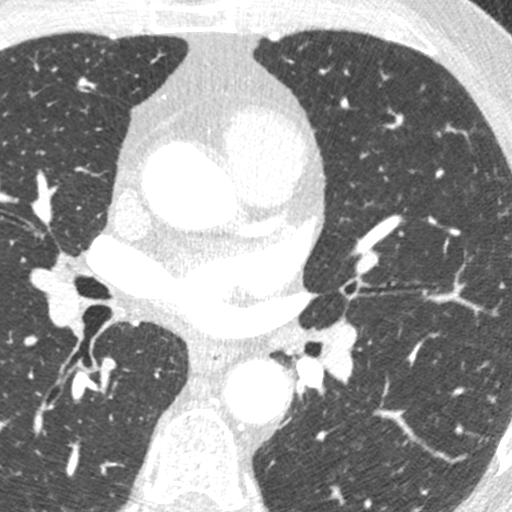

[Series 9: ts syst sharp 34 % · axial · 0.39mm/px · z∈[+1383,+1432]mm · 2 of 368 slices shown]
[im 123/368  lung]
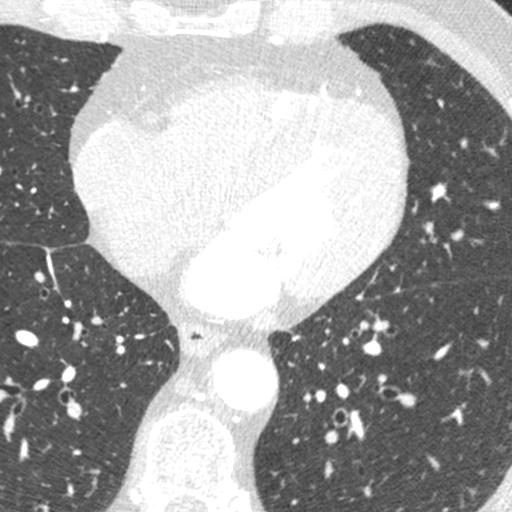
[im 245/368  lung]
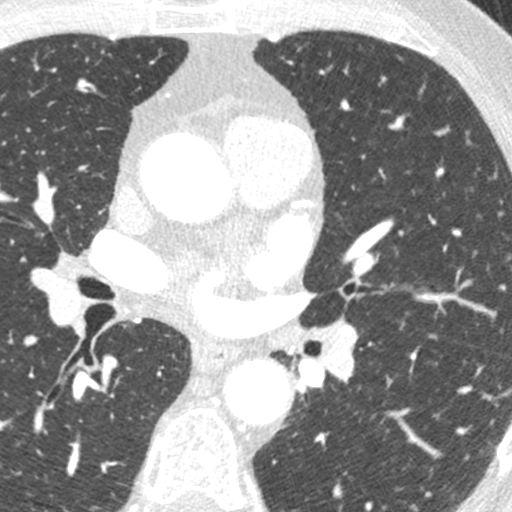

[8 of 20 positions shown; findings below may reference images not displayed]

FINDINGS: Aortic atherosclerosis. Within the visualized portions of the thorax
there are no suspicious appearing pulmonary nodules or masses, there
is no acute consolidative airspace disease, no pleural effusions, no
pneumothorax and no lymphadenopathy. Visualized portions of the
upper abdomen are unremarkable. There are no aggressive appearing
lytic or blastic lesions noted in the visualized portions of the
skeleton.
IMPRESSION: 1.  Aortic Atherosclerosis (RUUM2-2QF.F).
FINDINGS: A 120 kV prospective scan was triggered in the descending thoracic
aorta at 111 HU's. Axial non-contrast 3 mm slices were carried out
through the heart. The data set was analyzed on a dedicated work
station and scored using the Agatson method. Gantry rotation speed
was 250 msecs and collimation was .6 mm. No beta blockade and 0.8 mg
of sl NTG was given. The 3D data set was reconstructed in 5%
intervals of the 67-82 % of the R-R cycle. Diastolic phases were
analyzed on a dedicated work station using MPR, MIP and VRT modes.
The patient received 80 cc of contrast.

Aorta: Normal size. Ascending aorta 3.3 cm Mild calcification in the
aortic root and descending aorta. Atheromatous plaque noted in the
descending aorta. No dissection.

Aortic Valve:  Trileaflet.  No calcifications.

Coronary Arteries:  Normal coronary origin.  Right dominance.

RCA is a large dominant artery that gives rise to PDA and PLVB.
There is severe (>70%) soft plaque proximally followed by a heavily
calcified region proximally with 100% occlusion. There are spotty
calcifications in the mid RCA with no evidence of contrast in the
mid vessel. The distal RCA appears severely diseased but dose have
some contrast filling. The PDA fills well with contrast and had a
moderate (50-69%) mixed lesion proximally. The RCA is mildly
diseased distal to the PDA and fills three small PL branches. The
PDA appears to be filling from collaterals from the LAD.

Left main is a large artery that gives rise to LAD and LCX arteries.
There is minimal (<25%) calcified plaque distally.

LAD is a large vessel that has diffuse minimal (<25%) soft plaque.
There is a large, branching D1 with scattered minimal calcified
plaque proximally with mild (25-49%) calcified plaque in the mid
vessel.

LCX is a non-dominant artery that gives rise to three OM branches.
There is minimal calcified plaque at the ostial LCX followed by a
focal region of moderate (50-69%) mixed plaque prior to OM1.

Other findings:

Normal pulmonary vein drainage into the left atrium.

Normal let atrial appendage without a thrombus.

Normal size of the pulmonary artery.
IMPRESSION: 1.  Normal coronary origin with right dominance.

2. The RCA is completely occluded with evidence of collateral
filling of the distal RCA and PDA from the LAD.

3.  Cannot rule out moderate proximal LCX stenosis.

4.  Recommend cardiac catheterization.

*** End of Addendum ***
EXAM:
OVER-READ INTERPRETATION  CT CHEST

The following report is an over-read performed by radiologist Dr.
Foubert Mouloud [REDACTED] on 08/18/2019. This
over-read does not include interpretation of cardiac or coronary
anatomy or pathology. The coronary calcium score/coronary CTA
interpretation by the cardiologist is attached.
FINDINGS: Aortic atherosclerosis. Within the visualized portions of the thorax
there are no suspicious appearing pulmonary nodules or masses, there
is no acute consolidative airspace disease, no pleural effusions, no
pneumothorax and no lymphadenopathy. Visualized portions of the
upper abdomen are unremarkable. There are no aggressive appearing
lytic or blastic lesions noted in the visualized portions of the
skeleton.
IMPRESSION: 1.  Aortic Atherosclerosis (RUUM2-2QF.F).

## 2020-11-06 ENCOUNTER — Telehealth: Payer: Self-pay

## 2020-11-06 ENCOUNTER — Other Ambulatory Visit: Payer: Self-pay | Admitting: Cardiology

## 2020-11-06 NOTE — Telephone Encounter (Signed)
LM with pt to call and schedule AWV.

## 2020-11-26 ENCOUNTER — Other Ambulatory Visit: Payer: Self-pay | Admitting: Internal Medicine

## 2020-11-26 DIAGNOSIS — K219 Gastro-esophageal reflux disease without esophagitis: Secondary | ICD-10-CM

## 2021-01-23 ENCOUNTER — Ambulatory Visit: Payer: Self-pay

## 2021-01-23 NOTE — Telephone Encounter (Signed)
Pt called he has had dizziness and 1x vomiting over the past 4 days when he wakes up. Dizziness improves as day progresses.  Pt had another incident over the summer which he asked about at an office visit, but was not addressed.  Per protocol, pt should be seen in 24 hours. No appointments available. Sent teams messages to [place] - no answer. I am forwarding to office with high priority .

## 2021-01-23 NOTE — Telephone Encounter (Signed)
Reason for Disposition . [1] MODERATE dizziness (e.g., vertigo; feels very unsteady, interferes with normal activities) AND [2] has NOT been evaluated by physician for this  Answer Assessment - Initial Assessment Questions 1. DESCRIPTION: "Describe your dizziness."     Last 4 mornings dizzy and 1x vomiting 2. VERTIGO: "Do you feel like either you or the room is spinning or tilting?"      spinning 3. LIGHTHEADED: "Do you feel lightheaded?" (e.g., somewhat faint, woozy, weak upon standing)     Unable to stand 4. SEVERITY: "How bad is it?"  "Can you walk?"   - MILD: Feels slightly dizzy and unsteady, but is walking normally.   - MODERATE: Feels unsteady when walking, but not falling; interferes with normal activities (e.g., school, work).   - SEVERE: Unable to walk without falling, or requires assistance to walk without falling.     Gets better as day progresses 5. ONSET:  "When did the dizziness begin?"     4 days ago and once over the summer, which was discussed at visit during the summer 6. AGGRAVATING FACTORS: "Does anything make it worse?" (e.g., standing, change in head position)     na 7. CAUSE: "What do you think is causing the dizziness?"     unknown 8. RECURRENT SYMPTOM: "Have you had dizziness before?" If Yes, ask: "When was the last time?" "What happened that time?"     today 9. OTHER SYMPTOMS: "Do you have any other symptoms?" (e.g., headache, weakness, numbness, vomiting, earache)     vomiting 10. PREGNANCY: "Is there any chance you are pregnant?" "When was your last menstrual period?"       na  Protocols used: Dizziness - Vertigo-A-AH

## 2021-01-23 NOTE — Telephone Encounter (Signed)
Unable to reach patient at either contact.  No answer.

## 2021-01-24 NOTE — Telephone Encounter (Signed)
Per patient, he is feeling much better.  He opted to schedule an appt for physical after March 08, 2021.  Appt scheduled.   Advised to call office if he would like to f/u on sxs should they resume.  Patient verbalized understanding.

## 2021-02-06 ENCOUNTER — Other Ambulatory Visit: Payer: Self-pay | Admitting: Internal Medicine

## 2021-02-06 DIAGNOSIS — I25118 Atherosclerotic heart disease of native coronary artery with other forms of angina pectoris: Secondary | ICD-10-CM

## 2021-02-06 NOTE — Telephone Encounter (Signed)
Requested Prescriptions  Pending Prescriptions Disp Refills  . nitroGLYCERIN (NITROSTAT) 0.4 MG SL tablet [Pharmacy Med Name: Nitroglycerin 0.4 MG Sublingual Tablet Sublingual] 25 tablet 0    Sig: DISSOLVE ONE TABLET UNDER THE TONGUE EVERY 5 MINUTES AS NEEDED FOR CHEST PAIN(IF PAIN PERSIST AFTER 3 DOSES CALL EMS)     Cardiovascular:  Nitrates Failed - 02/06/2021  1:28 PM      Failed - Last BP in normal range    BP Readings from Last 1 Encounters:  10/14/20 (!) 161/88         Failed - Valid encounter within last 12 months    Recent Outpatient Visits          7 months ago Carpal tunnel syndrome, bilateral   [place], Neoma Laming B, MD   1 year ago Coronary artery disease of native artery of native heart with stable angina pectoris [facility])   [place], Neoma Laming B, MD   1 year ago Atherosclerosis of native coronary artery of native heart with stable angina pectoris [facility])   [place], Deborah B, MD   1 year ago Essential hypertension   [place], RPH-CPP   1 year ago Gastroesophageal reflux disease without esophagitis   [place] Osborne, MD      Future Appointments            In 1 month [place], Thana Farr, MD [place], [facility]   In 1 month Ladell Pier, MD [place] - Last Heart Rate in normal range    Pulse Readings from Last 1 Encounters:  10/14/20 64         Patient must keep upcoming appointment for further refills

## 2021-02-20 ENCOUNTER — Other Ambulatory Visit: Payer: Self-pay | Admitting: Internal Medicine

## 2021-02-20 DIAGNOSIS — K219 Gastro-esophageal reflux disease without esophagitis: Secondary | ICD-10-CM

## 2021-02-23 ENCOUNTER — Other Ambulatory Visit: Payer: Self-pay | Admitting: Internal Medicine

## 2021-02-23 DIAGNOSIS — K219 Gastro-esophageal reflux disease without esophagitis: Secondary | ICD-10-CM

## 2021-03-08 ENCOUNTER — Ambulatory Visit (INDEPENDENT_AMBULATORY_CARE_PROVIDER_SITE_OTHER): Payer: No Typology Code available for payment source | Admitting: Cardiology

## 2021-03-08 ENCOUNTER — Encounter: Payer: Self-pay | Admitting: Cardiology

## 2021-03-08 ENCOUNTER — Other Ambulatory Visit: Payer: Self-pay

## 2021-03-08 VITALS — BP 112/60 | HR 60 | Ht 70.8 in | Wt 219.0 lb

## 2021-03-08 DIAGNOSIS — E7800 Pure hypercholesterolemia, unspecified: Secondary | ICD-10-CM | POA: Diagnosis not present

## 2021-03-08 DIAGNOSIS — I2510 Atherosclerotic heart disease of native coronary artery without angina pectoris: Secondary | ICD-10-CM

## 2021-03-08 DIAGNOSIS — R42 Dizziness and giddiness: Secondary | ICD-10-CM | POA: Diagnosis not present

## 2021-03-08 DIAGNOSIS — I10 Essential (primary) hypertension: Secondary | ICD-10-CM | POA: Diagnosis not present

## 2021-03-08 DIAGNOSIS — I25118 Atherosclerotic heart disease of native coronary artery with other forms of angina pectoris: Secondary | ICD-10-CM

## 2021-03-08 MED ORDER — ISOSORBIDE MONONITRATE ER 60 MG PO TB24: 60.0000 mg | ORAL_TABLET | Freq: Every day | ORAL | 3 refills | Status: DC

## 2021-03-08 NOTE — Assessment & Plan Note (Signed)
Blood pressure has been fairly low on medications.

## 2021-03-08 NOTE — Patient Instructions (Signed)
Medication Instructions:  Please increase your Isosorbide to 60 mg a day. Continue all other medications as listed.  *If you need a refill on your cardiac medications before your next appointment, please call your pharmacy*  Follow-Up: At [facility], you and your health needs are our priority.  As part of our continuing mission to provide you with exceptional heart care, we have created designated Provider Care Teams.  These Care Teams include your primary Cardiologist (physician) and Advanced Practice Providers (APPs -  Physician Assistants and Nurse Practitioners) who all work together to provide you with the care you need, when you need it.  We recommend signing up for the patient portal called "MyChart".  Sign up information is provided on this After Visit Summary.  MyChart is used to connect with patients for Virtual Visits (Telemedicine).  Patients are able to view lab/test results, encounter notes, upcoming appointments, etc.  Non-urgent messages can be sent to your provider as well.   To learn more about what you can do with MyChart, go to [email or web address].    Your next appointment:   6 month(s)  The format for your next appointment:   In Person  Provider:   Candee Furbish, MD     Thank you for choosing [facility]!!

## 2021-03-08 NOTE — Assessment & Plan Note (Signed)
Vertigo-like symptoms occasionally.  Turns head in bed feels movement.  Be careful on ladders.

## 2021-03-08 NOTE — Progress Notes (Signed)
Cardiology Office Note:    Date:  03/08/2021   ID:  Casey James, DOB May 01, 1955, MRN [id number]  PCP:  Ladell Pier, MD   [place]  Cardiologist:  Candee Furbish, MD  Advanced Practice Provider:  No care team member to display Electrophysiologist:  None       Referring MD: Ladell Pier, MD     History of Present Illness:    Casey James is a 66 y.o. male here for the follow-up of coronary artery disease.  Previously seen by Dr. Irish Lack on 02/17/2020 with CTO of RCA on cath.  Chest pain began in 2020.  He had an episode where it was triggered by strenuous activity.  He was started on medications and his angina was much improved.  He did not tolerate the isosorbide initially but this seemed to improve.  Taking it now without much side effects.  He has been remodeling a house without any anginal symptoms.  If he does have chest discomfort, its mild and resolves in a few minutes.  His father died in his 10s of heart disease.  Today, he reports he still experiences chest discomfort upon exertion. He normally sits down and takes deep  breaths which help to resolve the pain.  He normally has to wait for the pain to completely disappear before going back to his activity otherwise the pain lingers. Adds that when the chest pain starts, it radiates to his lungs.   He mentions that he had an episode of syncope when he first started 30 mg Imdur . Since then, he has been doing well on the drug and no syncope episodes.  He notes side effects of watery eyes, vertigo and tinnitus. About 3 episodes of vertigo in the past 6 months.  He denies shortness of breath, palpitations, lightheadedness, headaches, syncope, LE edema, orthopnea, PND.   Past Medical History:  Diagnosis Date   Chest pain    Emphysema of lung ([place])    Former smoker    GERD (gastroesophageal reflux disease)     Past Surgical History:  Procedure Laterality Date   CHOLECYSTECTOMY      FRACTURE SURGERY     LEFT HEART CATH AND CORONARY ANGIOGRAPHY N/A 09/30/2019   Procedure: LEFT HEART CATH AND CORONARY ANGIOGRAPHY;  Surgeon: Burnell Blanks, MD;  Location: [facility];  Service: Cardiovascular;  Laterality: N/A;   pulmonary fibrosis      Current Medications: Current Meds  Medication Sig   aspirin EC 81 MG tablet Take 81 mg by mouth daily.   isosorbide mononitrate (IMDUR) 60 MG 24 hr tablet Take 1 tablet (60 mg total) by mouth daily.   metoprolol succinate (TOPROL-XL) 25 MG 24 hr tablet Take 1 tablet by mouth once daily   Multiple Vitamins-Minerals (CENTRUM) tablet Take 1 tablet by mouth daily.    nitroGLYCERIN (NITROSTAT) 0.4 MG SL tablet DISSOLVE ONE TABLET UNDER THE TONGUE EVERY 5 MINUTES AS NEEDED FOR CHEST PAIN(IF PAIN PERSIST AFTER 3 DOSES CALL EMS)   pantoprazole (PROTONIX) 40 MG tablet Take 1 tablet by mouth once daily   rosuvastatin (CRESTOR) 20 MG tablet Take 1 tablet by mouth once daily   [DISCONTINUED] isosorbide mononitrate (IMDUR) 30 MG 24 hr tablet Take 1 tablet by mouth once daily     Allergies:   Patient has no known allergies.   Social History   Socioeconomic History   Marital status: Married    Spouse name: Not on file  Number of children: Not on file   Years of education: Not on file   Highest education level: Not on file  Occupational History   Not on file  Tobacco Use   Smoking status: Former    Years: 50.00    Types: Cigarettes    Quit date: 10/21/2018    Years since quitting: 2.3   Smokeless tobacco: Never  Vaping Use   Vaping Use: Never used  Substance and Sexual Activity   Alcohol use: Not Currently   Drug use: Never   Sexual activity: Not on file  Other Topics Concern   Not on file  Social History Narrative   Not on file   Social Determinants of Health   Financial Resource Strain: Not on file  Food Insecurity: Not on file  Transportation Needs: Not on file  Physical Activity: Not on file  Stress: Not on  file  Social Connections: Not on file     Family History: The patient's family history includes Cancer in his mother; Diabetes in his brother; Heart disease in his father; Pulmonary fibrosis in his mother.  ROS:   Please see the history of present illness.   (+) vertigo (+) watery eyes (+) chest pain   All other systems reviewed and are negative.  EKGs/Labs/Other Studies Reviewed:    The following studies were reviewed today: Prior cardiac catheterization reviewed.  CTO RCA.  EKG  03/08/2021: sinus rhythm 60 pm with t wave inversion, anterior lateral leads  LEFT HEART CATH 09/30/2019:  Prox RCA lesion is 100% stenosed. Ost Cx to Prox Cx lesion is 50% stenosed. 1st Mrg lesion is 40% stenosed. Prox LAD to Mid LAD lesion is 30% stenosed. Dist LAD lesion is 30% stenosed. 1st Diag lesion is 40% stenosed. The left ventricular systolic function is normal. LV end diastolic pressure is normal. The left ventricular ejection fraction is greater than 65% by visual estimate. There is no mitral valve regurgitation.  1. Chronic total occlusion of the proximal RCA. The mid and distal RCA fills briskly from left to right collaterals. The RCA is a large dominant vessel. Collaterals supplied by the LAD 2. There is mild to moderate non-obstructive plaque in the proximal Circumflex and high first obtuse marginal branch. This is a moderate caliber non-dominant vessel.  3. The LAD has mild plaque in the proximal and mid vessel.  4. Normal LV systolic function    Recent Labs: No results found for requested labs within last 8760 hours.  Recent Lipid Panel    Component Value Date/Time   CHOL 130 02/10/2020 1528   TRIG 121 02/10/2020 1528   HDL 34 (L) 02/10/2020 1528   CHOLHDL 3.8 02/10/2020 1528   CHOLHDL 5.8 07/16/2012 1359   VLDL 36 07/16/2012 1359   LDLCALC 74 02/10/2020 1528     Risk Assessment/Calculations:      Physical Exam:    VS:  BP 112/60 (BP Location: Left Arm, Patient  Position: Sitting, Cuff Size: Normal)    Pulse 60    Ht 5' 10.8" (1.798 m)    Wt 219 lb (99.3 kg)    SpO2 96%    BMI 30.72 kg/m     Wt Readings from Last 3 Encounters:  03/08/21 219 lb (99.3 kg)  10/14/20 216 lb 9.6 oz (98.2 kg)  06/01/20 217 lb (98.4 kg)     GEN:  Well nourished, well developed in no acute distress HEENT: Normal NECK: No JVD; No carotid bruits LYMPHATICS: No lymphadenopathy CARDIAC: RRR, no murmurs,  rubs, gallops RESPIRATORY:  Clear to auscultation without rales, wheezing or rhonchi  ABDOMEN: Soft, non-tender, non-distended MUSCULOSKELETAL:  No edema; No deformity  SKIN: Warm and dry NEUROLOGIC:  Alert and oriented x 3 PSYCHIATRIC:  Normal affect   ASSESSMENT:    1. Coronary artery disease involving native coronary artery of native heart with other form of angina pectoris ([place])   2. Essential hypertension   3. Coronary artery disease involving native coronary artery of native heart without angina pectoris   4. Disequilibrium   5. Pure hypercholesterolemia     PLAN:    In order of problems listed above:  Coronary artery disease involving native coronary artery of native heart without angina pectoris CTO RCA proximally occluded.  Collaterals noted.  Moderate disease elsewhere on heart catheterization 09/2019.  Now having anginal symptoms.  He has to rest and stop breathing deeply and then they go away.  If he does not wait for them to go away, they will shortly return.  After he allows them to go away he is able to complete his test throughout the day without difficulty.  He has been taking isosorbide 30 mg as well as Toprol 25.  I will increase his isosorbide to 60 mg.  His EKG also demonstrates T wave inversion in the anterior lateral leads.  This is new from his prior EKG.  I once again personally reviewed his cardiac catheterization films.  If increasing the isosorbide does not work, I will consult with Dr. Angelena Form who performed heart  catheterization.  Essential hypertension Blood pressure has been fairly low on medications.  Disequilibrium Vertigo-like symptoms occasionally.  Turns head in bed feels movement.  Be careful on ladders.  Pure hypercholesterolemia Continue with Crestor 20 mg a day.  No myalgias.  LDL 74 at prior check.  Former smoker -Quit in 2020.  Excellent.  Says he drank and smoked for about 50 years.  Family history of coronary artery disease -Father died in his 35s from heart disease.  His brother in his 52s has pulmonary fibrosis and 6 months to live he states.  His mother died of the same thing.    Follow-up in 6 months.  Medication Adjustments/Labs and Tests Ordered: Current medicines are reviewed at length with the patient today.  Concerns regarding medicines are outlined above.  Orders Placed This Encounter  Procedures   EKG 12-Lead   Meds ordered this encounter  Medications   isosorbide mononitrate (IMDUR) 60 MG 24 hr tablet    Sig: Take 1 tablet (60 mg total) by mouth daily.    Dispense:  90 tablet    Refill:  3    Patient Instructions  Medication Instructions:  Please increase your Isosorbide to 60 mg a day. Continue all other medications as listed.  *If you need a refill on your cardiac medications before your next appointment, please call your pharmacy*  Follow-Up: At [facility], you and your health needs are our priority.  As part of our continuing mission to provide you with exceptional heart care, we have created designated Provider Care Teams.  These Care Teams include your primary Cardiologist (physician) and Advanced Practice Providers (APPs -  Physician Assistants and Nurse Practitioners) who all work together to provide you with the care you need, when you need it.  We recommend signing up for the patient portal called "MyChart".  Sign up information is provided on this After Visit Summary.  MyChart is used to connect with patients for Virtual Visits  (Telemedicine).  Patients are able to view lab/test results, encounter notes, upcoming appointments, etc.  Non-urgent messages can be sent to your provider as well.   To learn more about what you can do with MyChart, go to [email or web address].    Your next appointment:   6 month(s)  The format for your next appointment:   In Person  Provider:   Candee Furbish, MD     Thank you for choosing [facility]!!       I,Zite Okoli,acting as a scribe for Candee Furbish, MD.,have documented all relevant documentation on the behalf of Candee Furbish, MD,as directed by  Candee Furbish, MD while in the presence of Candee Furbish, MD.   PLAN:   Signed, Candee Furbish, MD  03/08/2021 2:54 PM    [place]

## 2021-03-08 NOTE — Assessment & Plan Note (Signed)
CTO RCA proximally occluded.  Collaterals noted.  Moderate disease elsewhere on heart catheterization 09/2019.  Now having anginal symptoms.  He has to rest and stop breathing deeply and then they go away.  If he does not wait for them to go away, they will shortly return.  After he allows them to go away he is able to complete his test throughout the day without difficulty.  He has been taking isosorbide 30 mg as well as Toprol 25.  I will increase his isosorbide to 60 mg.  His EKG also demonstrates T wave inversion in the anterior lateral leads.  This is new from his prior EKG.  I once again personally reviewed his cardiac catheterization films.  If increasing the isosorbide does not work, I will consult with Dr. Angelena Form who performed heart catheterization.

## 2021-03-08 NOTE — Assessment & Plan Note (Signed)
Continue with Crestor 20 mg a day.  No myalgias.  LDL 74 at prior check.

## 2021-03-14 ENCOUNTER — Other Ambulatory Visit: Payer: Self-pay

## 2021-03-14 ENCOUNTER — Encounter: Payer: Self-pay | Admitting: Internal Medicine

## 2021-03-14 ENCOUNTER — Ambulatory Visit: Payer: No Typology Code available for payment source | Attending: Internal Medicine | Admitting: Internal Medicine

## 2021-03-14 VITALS — BP 136/74 | HR 63 | Resp 16 | Ht 70.0 in | Wt 220.4 lb

## 2021-03-14 DIAGNOSIS — H8112 Benign paroxysmal vertigo, left ear: Secondary | ICD-10-CM

## 2021-03-14 DIAGNOSIS — I25118 Atherosclerotic heart disease of native coronary artery with other forms of angina pectoris: Secondary | ICD-10-CM | POA: Diagnosis not present

## 2021-03-14 DIAGNOSIS — D229 Melanocytic nevi, unspecified: Secondary | ICD-10-CM | POA: Diagnosis not present

## 2021-03-14 DIAGNOSIS — Z125 Encounter for screening for malignant neoplasm of prostate: Secondary | ICD-10-CM

## 2021-03-14 DIAGNOSIS — Z2821 Immunization not carried out because of patient refusal: Secondary | ICD-10-CM | POA: Diagnosis not present

## 2021-03-14 DIAGNOSIS — Z0000 Encounter for general adult medical examination without abnormal findings: Secondary | ICD-10-CM

## 2021-03-14 DIAGNOSIS — H538 Other visual disturbances: Secondary | ICD-10-CM | POA: Diagnosis not present

## 2021-03-14 DIAGNOSIS — E669 Obesity, unspecified: Secondary | ICD-10-CM | POA: Diagnosis not present

## 2021-03-14 DIAGNOSIS — E66811 Obesity, class 1: Secondary | ICD-10-CM

## 2021-03-14 DIAGNOSIS — I10 Essential (primary) hypertension: Secondary | ICD-10-CM

## 2021-03-14 NOTE — Patient Instructions (Signed)
Benign Positional Vertigo Vertigo is the feeling that you or your surroundings are moving when they are not. Benign positional vertigo is the most common form of vertigo. This is usually a harmless condition (benign). This condition is positional. This means that symptoms are triggered by certain movements and positions. This condition can be dangerous if it occurs while you are doing something that could cause harm to yourself or others. This includes activities such as driving or operating machinery. What are the causes? The inner ear has fluid-filled canals that help your brain sense movement and balance. When the fluid moves, the brain receives messages about your body's position. With benign positional vertigo, calcium crystals in the inner ear break free and disturb the inner ear area. This causes your brain to receive confusing messages about your body's position. What increases the risk? You are more likely to develop this condition if: You are a woman. You are 29 years of age or older. You have recently had a head injury. You have an inner ear disease. What are the signs or symptoms? Symptoms of this condition usually happen when you move your head or your eyes in different directions. Symptoms may start suddenly and usually last for less than a minute. They include: Loss of balance and falling. Feeling like you are spinning or moving. Feeling like your surroundings are spinning or moving. Nausea and vomiting. Blurred vision. Dizziness. Involuntary eye movement (nystagmus). Symptoms can be mild and cause only minor problems, or they can be severe and interfere with daily life. Episodes of benign positional vertigo may return (recur) over time. Symptoms may also improve over time. How is this diagnosed? This condition may be diagnosed based on: Your medical history. A physical exam of the head, neck, and ears. Positional tests to check for or stimulate vertigo. You may be asked to  turn your head and change positions, such as going from sitting to lying down. A health care provider will watch for symptoms of vertigo. You may be referred to a health care provider who specializes in ear, nose, and throat problems (ENT or otolaryngologist) or a provider who specializes in disorders of the nervous system (neurologist). How is this treated? This condition may be treated in a session in which your health care provider moves your head in specific positions to help the displaced crystals in your inner ear move. Treatment for this condition may take several sessions. Surgery may be needed in severe cases, but this is rare. In some cases, benign positional vertigo may resolve on its own in 2-4 weeks. Follow these instructions at home: Safety Move slowly. Avoid sudden body or head movements or certain positions, as told by your health care provider. Avoid driving or operating machinery until your health care provider says it is safe. Avoid doing any tasks that would be dangerous to you or others if vertigo occurs. If you have trouble walking or keeping your balance, try using a cane for stability. If you feel dizzy or unstable, sit down right away. Return to your normal activities as told by your health care provider. Ask your health care provider what activities are safe for you. General instructions Take over-the-counter and prescription medicines only as told by your health care provider. Drink enough fluid to keep your urine pale yellow. Keep all follow-up visits. This is important. Contact a health care provider if: You have a fever. Your condition gets worse or you develop new symptoms. Your family or friends notice any behavioral changes. You  have nausea or vomiting that gets worse. You have numbness or a prickling and tingling sensation. Get help right away if you: Have difficulty speaking or moving. Are always dizzy or faint. Develop severe headaches. Have weakness in  your legs or arms. Have changes in your hearing or vision. Develop a stiff neck. Develop sensitivity to light. These symptoms may represent a serious problem that is an emergency. Do not wait to see if the symptoms will go away. Get medical help right away. Call your local emergency services (911 in the [place] Do not drive yourself to the hospital. Summary Vertigo is the feeling that you or your surroundings are moving when they are not. Benign positional vertigo is the most common form of vertigo. This condition is caused by calcium crystals in the inner ear that become displaced. This causes a disturbance in an area of the inner ear that helps your brain sense movement and balance. Symptoms include loss of balance and falling, feeling that you or your surroundings are moving, nausea and vomiting, and blurred vision. This condition can be diagnosed based on symptoms, a physical exam, and positional tests. Follow safety instructions as told by your health care provider and keep all follow-up visits. This is important. This information is not intended to replace advice given to you by your health care provider. Make sure you discuss any questions you have with your health care provider. Document Revised: 01/13/2020 Document Reviewed: 01/13/2020 Elsevier Patient Education  2022 [place] Following a healthy eating pattern may help you to achieve and maintain a healthy body weight, reduce the risk of chronic disease, and live a long and productive life. It is important to follow a healthy eating pattern at an appropriate calorie level for your body. Your nutritional needs should be met primarily through food by choosing a variety of nutrient-rich foods. What are tips for following this plan? Reading food labels Read labels and choose the following: Reduced or low sodium. Juices with 100% fruit juice. Foods with low saturated fats and high polyunsaturated and monounsaturated  fats. Foods with whole grains, such as whole wheat, cracked wheat, brown rice, and wild rice. Whole grains that are fortified with folic acid. This is recommended for women who are pregnant or who want to become pregnant. Read labels and avoid the following: Foods with a lot of added sugars. These include foods that contain brown sugar, corn sweetener, corn syrup, dextrose, fructose, glucose, high-fructose corn syrup, honey, invert sugar, lactose, malt syrup, maltose, molasses, raw sugar, sucrose, trehalose, or turbinado sugar. Do not eat more than the following amounts of added sugar per day: 6 teaspoons (25 g) for women. 9 teaspoons (38 g) for men. Foods that contain processed or refined starches and grains. Refined grain products, such as white flour, degermed cornmeal, white bread, and white rice. Shopping Choose nutrient-rich snacks, such as vegetables, whole fruits, and nuts. Avoid high-calorie and high-sugar snacks, such as potato chips, fruit snacks, and candy. Use oil-based dressings and spreads on foods instead of solid fats such as butter, stick margarine, or cream cheese. Limit pre-made sauces, mixes, and "instant" products such as flavored rice, instant noodles, and ready-made pasta. Try more plant-protein sources, such as tofu, tempeh, black beans, edamame, lentils, nuts, and seeds. Explore eating plans such as the Mediterranean diet or vegetarian diet. Cooking Use oil to saut or stir-fry foods instead of solid fats such as butter, stick margarine, or lard. Try baking, boiling, grilling, or broiling instead of frying.  Remove the fatty part of meats before cooking. Steam vegetables in water or broth. Meal planning  At meals, imagine dividing your plate into fourths: One-half of your plate is fruits and vegetables. One-fourth of your plate is whole grains. One-fourth of your plate is protein, especially lean meats, poultry, eggs, tofu, beans, or nuts. Include low-fat dairy as  part of your daily diet. Lifestyle Choose healthy options in all settings, including home, work, school, restaurants, or stores. Prepare your food safely: Wash your hands after handling raw meats. Keep food preparation surfaces clean by regularly washing with hot, soapy water. Keep raw meats separate from ready-to-eat foods, such as fruits and vegetables. Cook seafood, meat, poultry, and eggs to the recommended internal temperature. Store foods at safe temperatures. In general: Keep cold foods at 57F (4.4C) or below. Keep hot foods at 157F (60C) or above. Keep your freezer at [facility] (-17.8C) or below. Foods are no longer safe to eat when they have been between the temperatures of 40-157F (4.4-60C) for more than 2 hours. What foods should I eat? Fruits Aim to eat 2 cup-equivalents of fresh, canned (in natural juice), or frozen fruits each day. Examples of 1 cup-equivalent of fruit include 1 small apple, 8 large strawberries, 1 cup canned fruit,  cup dried fruit, or 1 cup 100% juice. Vegetables Aim to eat 2-3 cup-equivalents of fresh and frozen vegetables each day, including different varieties and colors. Examples of 1 cup-equivalent of vegetables include 2 medium carrots, 2 cups raw, leafy greens, 1 cup chopped vegetable (raw or cooked), or 1 medium baked potato. Grains Aim to eat 6 ounce-equivalents of whole grains each day. Examples of 1 ounce-equivalent of grains include 1 slice of bread, 1 cup ready-to-eat cereal, 3 cups popcorn, or  cup cooked rice, pasta, or cereal. Meats and other proteins Aim to eat 5-6 ounce-equivalents of protein each day. Examples of 1 ounce-equivalent of protein include 1 egg, 1/2 cup nuts or seeds, or 1 tablespoon (16 g) peanut butter. A cut of meat or fish that is the size of a deck of cards is about 3-4 ounce-equivalents. Of the protein you eat each week, try to have at least 8 ounces come from seafood. This includes salmon, trout, herring, and  anchovies. Dairy Aim to eat 3 cup-equivalents of fat-free or low-fat dairy each day. Examples of 1 cup-equivalent of dairy include 1 cup (240 mL) milk, 8 ounces (250 g) yogurt, 1 ounces (44 g) natural cheese, or 1 cup (240 mL) fortified soy milk. Fats and oils Aim for about 5 teaspoons (21 g) per day. Choose monounsaturated fats, such as canola and olive oils, avocados, peanut butter, and most nuts, or polyunsaturated fats, such as sunflower, corn, and soybean oils, walnuts, pine nuts, sesame seeds, sunflower seeds, and flaxseed. Beverages Aim for six 8-oz glasses of water per day. Limit coffee to three to five 8-oz cups per day. Limit caffeinated beverages that have added calories, such as soda and energy drinks. Limit alcohol intake to no more than 1 drink a day for nonpregnant women and 2 drinks a day for men. One drink equals 12 oz of beer (355 mL), 5 oz of wine (148 mL), or 1 oz of hard liquor (44 mL). Seasoning and other foods Avoid adding excess amounts of salt to your foods. Try flavoring foods with herbs and spices instead of salt. Avoid adding sugar to foods. Try using oil-based dressings, sauces, and spreads instead of solid fats. This information is based on general [place] nutrition  guidelines. For more information, visit [employer]. Exact amounts may vary based on your nutrition needs. Summary A healthy eating plan may help you to maintain a healthy weight, reduce the risk of chronic diseases, and stay active throughout your life. Plan your meals. Make sure you eat the right portions of a variety of nutrient-rich foods. Try baking, boiling, grilling, or broiling instead of frying. Choose healthy options in all settings, including home, work, school, restaurants, or stores. This information is not intended to replace advice given to you by your health care provider. Make sure you discuss any questions you have with your health care provider. Document Revised: 10/11/2020  Document Reviewed: 10/11/2020 Elsevier Patient Education  [place].

## 2021-03-14 NOTE — Progress Notes (Signed)
Patient ID: Casey James, male    DOB: 1955-10-14  MRN: [id number]  CC: Annual Exam   Subjective: Casey James is a 66 y.o. male who presents for annual exam His concerns today include:  GERD, former smoker, HL, HTN, obesity, marijuana use, CAD.    CAD/HTN:  saw [facility] 03/08/2021.  He reported some increased anginal symptoms with exertion.  Imdur was increased to 60 mg daily.  He is doing better with the increased dose.  He has not had to use any sublingual nitroglycerin since the dose of the Imdur was increased. Reports compliance with his other medications including metoprolol, Crestor, aspirin Limits salt NO LE edema.  HA first time he took increase dose of Imdur, has not persist.  Checks his blood pressure regularly at home.  Reports his readings are always in the 120s over 22s.  Recent reading at Dr. Kandis Mannan office was 112/60.  Complains of "an equilibrium problem."  Reports feeling of dizziness that lasts a quick second when turning over in bed.  First episode occurred around May of last year and was associated with dizziness, nausea and sweating on the forehead and ringing in the ears.  Since then he has had 2 other episodes.  Each episode lasts only a few seconds.  Obesity: He reports that his portion sizes are fairly small but he is discouraged that he is not losing weight.  He also states that he is not as active as he would like to be because of intermittent issues with angina.  C/o watery eyes when he bends over to do something.  This causes blurred vision when it happens.  He wears reading glasses.  He has not seen an eye doctor in several years.    GERD: taking and tolerating pantoprazole.  Reports acid reflux symptoms are under good control.   Patient Active Problem List   Diagnosis Date Noted   Disequilibrium 03/08/2021   Pure hypercholesterolemia 03/08/2021   Influenza vaccine refused 01/12/2020   Coronary artery disease involving native coronary artery of  native heart without angina pectoris    Essential hypertension 09/08/2019   Obesity (BMI 30-39.9) 07/28/2019   Marijuana user 07/28/2019   Chest pain in adult 06/23/2019   Gastroesophageal reflux disease without esophagitis 06/23/2019   Former smoker 06/23/2019   Ptosis of eyelid, right 07/13/2011     Current Outpatient Medications on File Prior to Visit  Medication Sig Dispense Refill   aspirin EC 81 MG tablet Take 81 mg by mouth daily.     isosorbide mononitrate (IMDUR) 60 MG 24 hr tablet Take 1 tablet (60 mg total) by mouth daily. 90 tablet 3   metoprolol succinate (TOPROL-XL) 25 MG 24 hr tablet Take 1 tablet by mouth once daily 90 tablet 2   Multiple Vitamins-Minerals (CENTRUM) tablet Take 1 tablet by mouth daily.      nitroGLYCERIN (NITROSTAT) 0.4 MG SL tablet DISSOLVE ONE TABLET UNDER THE TONGUE EVERY 5 MINUTES AS NEEDED FOR CHEST PAIN(IF PAIN PERSIST AFTER 3 DOSES CALL EMS) 25 tablet 0   pantoprazole (PROTONIX) 40 MG tablet Take 1 tablet by mouth once daily 90 tablet 0   rosuvastatin (CRESTOR) 20 MG tablet Take 1 tablet by mouth once daily 90 tablet 2   No current facility-administered medications on file prior to visit.    No Known Allergies  Social History   Socioeconomic History   Marital status: Married    Spouse name: Not on file   Number of children: Not on  file   Years of education: Not on file   Highest education level: Not on file  Occupational History   Not on file  Tobacco Use   Smoking status: Former    Years: 50.00    Types: Cigarettes    Quit date: 10/21/2018    Years since quitting: 2.3   Smokeless tobacco: Never  Vaping Use   Vaping Use: Never used  Substance and Sexual Activity   Alcohol use: Not Currently   Drug use: Never   Sexual activity: Not on file  Other Topics Concern   Not on file  Social History Narrative   Not on file   Social Determinants of Health   Financial Resource Strain: Not on file  Food Insecurity: Not on file   Transportation Needs: Not on file  Physical Activity: Not on file  Stress: Not on file  Social Connections: Not on file  Intimate Partner Violence: Not on file    Family History  Problem Relation Age of Onset   Cancer Mother    Pulmonary fibrosis Mother    Diabetes Brother    Heart disease Father     Past Surgical History:  Procedure Laterality Date   CHOLECYSTECTOMY     FRACTURE SURGERY     LEFT HEART CATH AND CORONARY ANGIOGRAPHY N/A 09/30/2019   Procedure: LEFT HEART CATH AND CORONARY ANGIOGRAPHY;  Surgeon: Burnell Blanks, MD;  Location: [facility];  Service: Cardiovascular;  Laterality: N/A;   pulmonary fibrosis      ROS: Review of Systems  Constitutional:  Negative for fatigue and fever.  HENT:  Negative for hearing loss, sneezing and trouble swallowing.   Respiratory:  Negative for shortness of breath.   Gastrointestinal:  Negative for blood in stool.       Reports that he is moving his bowels okay.  He has dentures but they no longer fit.  He now has good Designer, fashion/clothing and plans to see a dentist.  Genitourinary:  Negative for difficulty urinating and hematuria.  Neurological:  Negative for syncope.  Negative except as stated above  PHYSICAL EXAM: BP 136/74    Pulse 63    Resp 16    Ht 5\' 10"  (1.778 m)    Wt 220 lb 6.4 oz (100 kg)    SpO2 96%    BMI 31.62 kg/m   Wt Readings from Last 3 Encounters:  03/14/21 220 lb 6.4 oz (100 kg)  03/08/21 219 lb (99.3 kg)  10/14/20 216 lb 9.6 oz (98.2 kg)    Physical Exam   General appearance - alert, well appearing, and in no distress Mental status - normal mood, behavior, speech, dress, motor activity, and thought processes Eyes -mild ptosis of the right upper eyelid  Ears - bilateral TM's and external ear canals normal Nose - normal and patent, no erythema, discharge or polyps Mouth -oral mucosa is moist.  He is edentulous above.  He has about 4 teeth in the lower gum. Neck - supple, no significant  adenopathy Chest - clear to auscultation, no wheezes, rales or rhonchi, symmetric air entry Heart - normal rate, regular rhythm, normal S1, S2, no murmurs, rubs, clicks or gallops Abdomen - soft, nontender, nondistended, no masses or organomegaly Neurological - cranial nerves II through XII intact, motor and sensory grossly normal bilaterally.  His gait is stable. Musculoskeletal -good range of motion of the large joints. Extremities - peripheral pulses normal, no pedal edema, no clubbing or cyanosis Skin -he has some  freckling on his scalp.  He has a mold on the left forehead that is about 1 cm in size.  Few dark spots over the left cheek.  CMP Latest Ref Rng & Units 09/28/2019 07/28/2019 07/16/2012  Glucose 65 - 99 mg/dL 99 86 98  BUN 8 - 27 mg/dL 12 11 8   Creatinine 0.76 - 1.27 mg/dL 0.92 1.08 0.87  Sodium 134 - 144 mmol/L 139 138 140  Potassium 3.5 - 5.2 mmol/L 4.8 4.9 4.0  Chloride 96 - 106 mmol/L 104 100 107  CO2 20 - 29 mmol/L 24 23 22   Calcium 8.6 - 10.2 mg/dL 9.4 9.4 9.2  Total Protein 6.0 - 8.5 g/dL - 7.6 7.0  Total Bilirubin 0.0 - 1.2 mg/dL - 0.7 0.9  Alkaline Phos 48 - 121 IU/L - 98 70  AST 0 - 40 IU/L - 17 20  ALT 0 - 44 IU/L - 18 16   Lipid Panel     Component Value Date/Time   CHOL 130 02/10/2020 1528   TRIG 121 02/10/2020 1528   HDL 34 (L) 02/10/2020 1528   CHOLHDL 3.8 02/10/2020 1528   CHOLHDL 5.8 07/16/2012 1359   VLDL 36 07/16/2012 1359   LDLCALC 74 02/10/2020 1528    CBC    Component Value Date/Time   WBC 9.5 09/28/2019 1412   WBC 11.1 (H) 07/16/2012 1359   RBC 4.87 09/28/2019 1412   RBC 5.12 07/16/2012 1359   HGB 14.8 09/28/2019 1412   HCT 43.7 09/28/2019 1412   PLT 217 09/28/2019 1412   MCV 90 09/28/2019 1412   MCH 30.4 09/28/2019 1412   MCH 30.7 07/16/2012 1359   MCHC 33.9 09/28/2019 1412   MCHC 34.7 07/16/2012 1359   RDW 15.1 09/28/2019 1412   LYMPHSABS 2.4 07/16/2012 1359   MONOABS 0.6 07/16/2012 1359   EOSABS 0.1 07/16/2012 1359   BASOSABS  0.0 07/16/2012 1359    ASSESSMENT AND PLAN: 1. Annual physical exam Encourage patient to establish care with a dentist.  2. Essential hypertension Blood pressure little elevated today.  No changes made in medications as home blood pressure readings have been good on recent reading at the cardiology office was normal. - CBC - Comprehensive metabolic panel  3. Coronary artery disease of native artery of native heart with stable angina pectoris [facility]) Recommend that he speak with Dr. Marlou Porch about possibly doing some cardiac rehab.  He will continue his current medications including metoprolol, Imdur, aspirin and Crestor. - Lipid panel  4. Benign paroxysmal positional vertigo of left ear Discussed diagnosis with him.  Episodes are very infrequent.  Advised patient if they start happening more often, I can refer him for vestibular training.  5. Atypical mole - Ambulatory referral to Dermatology  6. Obesity (BMI 30.0-34.9) Discussed and encourage healthy eating habits. Patient advised to eliminate sugary drinks from the diet, cut back on portion sizes especially of white carbohydrates, eat more white lean meat like chicken [place] and seafood instead of beef or pork and incorporate fresh fruits and vegetables into the diet daily.   7. Blurred vision, bilateral - Ambulatory referral to Ophthalmology  8. Prostate cancer screening And agreeable to PSA level for prostate cancer screening. - PSA  9. Influenza vaccination declined Recommended.  Patient declined.  10. Pneumococcal vaccination declined Recommended.  Patient declined.  Patient advised to get the new COVID 19 by valent booster vaccine.  Patient was given the opportunity to ask questions.  Patient verbalized understanding of the plan and was  able to repeat key elements of the plan.   Orders Placed This Encounter  Procedures   CBC   Comprehensive metabolic panel   Lipid panel   PSA   Ambulatory referral to Ophthalmology    Ambulatory referral to Dermatology     Requested Prescriptions    No prescriptions requested or ordered in this encounter    Return in about 4 months (around 07/12/2021).  Karle Plumber, MD, FACP

## 2021-03-15 LAB — COMPREHENSIVE METABOLIC PANEL
ALT: 21 IU/L (ref 0–44)
AST: 20 IU/L (ref 0–40)
Albumin/Globulin Ratio: 1.8 (ref 1.2–2.2)
Albumin: 4.4 g/dL (ref 3.8–4.8)
Alkaline Phosphatase: 83 IU/L (ref 44–121)
BUN/Creatinine Ratio: 8 — ABNORMAL LOW (ref 10–24)
BUN: 10 mg/dL (ref 8–27)
Bilirubin Total: 0.8 mg/dL (ref 0.0–1.2)
CO2: 25 mmol/L (ref 20–29)
Calcium: 9.1 mg/dL (ref 8.6–10.2)
Chloride: 102 mmol/L (ref 96–106)
Creatinine, Ser: 1.22 mg/dL (ref 0.76–1.27)
Globulin, Total: 2.5 g/dL (ref 1.5–4.5)
Glucose: 104 mg/dL — ABNORMAL HIGH (ref 70–99)
Potassium: 4.7 mmol/L (ref 3.5–5.2)
Sodium: 141 mmol/L (ref 134–144)
Total Protein: 6.9 g/dL (ref 6.0–8.5)
eGFR: 65 mL/min/{1.73_m2} (ref >59)

## 2021-03-15 LAB — CBC
Hematocrit: 45.8 % (ref 37.5–51.0)
Hemoglobin: 15.6 g/dL (ref 13.0–17.7)
MCH: 30.4 pg (ref 26.6–33.0)
MCHC: 34.1 g/dL (ref 31.5–35.7)
MCV: 89 fL (ref 79–97)
Platelets: 219 10*3/uL (ref 150–450)
RBC: 5.13 x10E6/uL (ref 4.14–5.80)
RDW: 13.6 % (ref 11.6–15.4)
WBC: 10 10*3/uL (ref 3.4–10.8)

## 2021-03-15 LAB — LIPID PANEL
Chol/HDL Ratio: 3.9 ratio (ref 0.0–5.0)
Cholesterol, Total: 131 mg/dL (ref 100–199)
HDL: 34 mg/dL — ABNORMAL LOW (ref >39)
LDL Chol Calc (NIH): 64 mg/dL (ref 0–99)
Triglycerides: 199 mg/dL — ABNORMAL HIGH (ref 0–149)
VLDL Cholesterol Cal: 33 mg/dL (ref 5–40)

## 2021-03-15 LAB — PSA: Prostate Specific Ag, Serum: 1.1 ng/mL (ref 0.0–4.0)

## 2021-03-15 NOTE — Progress Notes (Signed)
Let patient know that kidney function mildly decreased compared to when last checked in August 2021.  We will observe for now.  Liver function test normal.  Blood cell counts are normal.  Cholesterol level good.  PSA which is the screening test for prostate cancer is normal.

## 2021-04-19 ENCOUNTER — Other Ambulatory Visit: Payer: Self-pay

## 2021-04-19 ENCOUNTER — Ambulatory Visit (INDEPENDENT_AMBULATORY_CARE_PROVIDER_SITE_OTHER): Payer: No Typology Code available for payment source

## 2021-04-19 ENCOUNTER — Ambulatory Visit
Admission: EM | Admit: 2021-04-19 | Discharge: 2021-04-19 | Disposition: A | Discharge status: Home / Self Care | Payer: No Typology Code available for payment source | Attending: Physician Assistant | Admitting: Physician Assistant

## 2021-04-19 DIAGNOSIS — M25561 Pain in right knee: Secondary | ICD-10-CM

## 2021-04-19 HISTORY — DX: Essential (primary) hypertension: I10

## 2021-04-19 MED ORDER — PREDNISONE 20 MG PO TABS: 40.0000 mg | ORAL_TABLET | Freq: Every day | ORAL | 0 refills | Status: AC

## 2021-04-19 NOTE — ED Triage Notes (Signed)
Pt c/o severe right knee pain onset last weekend states this happened before ~ 2 years ago and it took ~ 30 days to recover spontaneously. States he had gout in his toes a few years ago. Was doing yard work day prior to pain onset.

## 2021-04-19 NOTE — ED Provider Notes (Signed)
[facility]    CSN: [id number] Arrival date & time: 04/19/21  1449      History   Chief Complaint Chief Complaint  Patient presents with   right knee pain    HPI Casey James is a 66 y.o. male.   Patient here today for evaluation of right knee pain that started about 3 to 4 days ago.  He reports that he had similar in the past that eventually resolved after about 30 days.  He notes that weightbearing makes pain worse.  He does have some swelling diffusely to his right knee.  He denies any known injury but does note that he was working on his outdoor equipment the day before symptoms began.  The history is provided by the patient.   Past Medical History:  Diagnosis Date   Chest pain    Emphysema of lung ([place])    Former smoker    GERD (gastroesophageal reflux disease)    Hypertension     Patient Active Problem List   Diagnosis Date Noted   Benign paroxysmal positional vertigo of left ear 03/14/2021   Disequilibrium 03/08/2021   Pure hypercholesterolemia 03/08/2021   Influenza vaccine refused 01/12/2020   Coronary artery disease involving native coronary artery of native heart without angina pectoris    Essential hypertension 09/08/2019   Obesity (BMI 30-39.9) 07/28/2019   Marijuana user 07/28/2019   Chest pain in adult 06/23/2019   Gastroesophageal reflux disease without esophagitis 06/23/2019   Former smoker 06/23/2019   Ptosis of eyelid, right 07/13/2011    Past Surgical History:  Procedure Laterality Date   CHOLECYSTECTOMY     FRACTURE SURGERY     LEFT HEART CATH AND CORONARY ANGIOGRAPHY N/A 09/30/2019   Procedure: LEFT HEART CATH AND CORONARY ANGIOGRAPHY;  Surgeon: Burnell Blanks, MD;  Location: [facility];  Service: Cardiovascular;  Laterality: N/A;   pulmonary fibrosis         Home Medications    Prior to Admission medications   Medication Sig Start Date End Date Taking? Authorizing Provider  predniSONE (DELTASONE) 20  MG tablet Take 2 tablets (40 mg total) by mouth daily with breakfast for 5 days. 04/19/21 04/24/21 Yes Francene Finders, PA-C  aspirin EC 81 MG tablet Take 81 mg by mouth daily.    [provider]  isosorbide mononitrate (IMDUR) 60 MG 24 hr tablet Take 1 tablet (60 mg total) by mouth daily. 03/08/21   Jerline Pain, MD  metoprolol succinate (TOPROL-XL) 25 MG 24 hr tablet Take 1 tablet by mouth once daily 11/07/20   Jerline Pain, MD  Multiple Vitamins-Minerals (CENTRUM) tablet Take 1 tablet by mouth daily.     [provider]  nitroGLYCERIN (NITROSTAT) 0.4 MG SL tablet DISSOLVE ONE TABLET UNDER THE TONGUE EVERY 5 MINUTES AS NEEDED FOR CHEST PAIN(IF PAIN PERSIST AFTER 3 DOSES CALL EMS) 02/06/21   Ladell Pier, MD  pantoprazole (PROTONIX) 40 MG tablet Take 1 tablet by mouth once daily 02/21/21   Ladell Pier, MD  rosuvastatin (CRESTOR) 20 MG tablet Take 1 tablet by mouth once daily 11/07/20   Jerline Pain, MD    Family History Family History  Problem Relation Age of Onset   Cancer Mother    Pulmonary fibrosis Mother    Diabetes Brother    Heart disease Father     Social History Social History   Tobacco Use   Smoking status: Former    Years: 50.00  Types: Cigarettes    Quit date: 10/21/2018    Years since quitting: 2.4   Smokeless tobacco: Never  Vaping Use   Vaping Use: Never used  Substance Use Topics   Alcohol use: Not Currently   Drug use: Never     Allergies   Patient has no known allergies.   Review of Systems Review of Systems  Constitutional:  Negative for chills and fever.  Eyes:  Negative for discharge and redness.  Musculoskeletal:  Positive for arthralgias and joint swelling.  Skin:  Negative for color change and wound.  Neurological:  Negative for numbness.    Physical Exam Triage Vital Signs ED Triage Vitals [04/19/21 1531]  Enc Vitals Group     BP (!) 191/83     Pulse Rate 91     Resp 18     Temp 98.3 F (36.8 C)      Temp Source Oral     SpO2 95 %     Weight      Height      Head Circumference      Peak Flow      Pain Score 0     Pain Loc      Pain Edu?      Excl. in [place]?    No data found.  Updated Vital Signs BP 135/79    Pulse 91    Temp 98.3 F (36.8 C) (Oral)    Resp 18    SpO2 95%      Physical Exam Vitals and nursing note reviewed.  Constitutional:      General: He is not in acute distress.    Appearance: Normal appearance. He is not ill-appearing.  HENT:     Head: Normocephalic and atraumatic.  Eyes:     Conjunctiva/sclera: Conjunctivae normal.  Cardiovascular:     Rate and Rhythm: Normal rate.  Pulmonary:     Effort: Pulmonary effort is normal.  Musculoskeletal:     Comments: Antalgic gait noted.  Mild decreased range of motion of right knee due to pain at extremes.  Mild diffuse swelling without erythema to anterior right knee  Neurological:     Mental Status: He is alert.  Psychiatric:        Mood and Affect: Mood normal.        Behavior: Behavior normal.        Thought Content: Thought content normal.     UC Treatments / Results  Labs (all labs ordered are listed, but only abnormal results are displayed) Labs Reviewed - No data to display  EKG   Radiology DG Knee Complete 4 Views Right  Result Date: 04/19/2021 CLINICAL DATA:  Severe right knee pain, no specific injury. EXAM: RIGHT KNEE - COMPLETE 4+ VIEW COMPARISON:  None. FINDINGS: Small knee joint effusion.  No acute osseous abnormality. IMPRESSION: Small knee joint effusion.  No acute osseous abnormality. Electronically Signed   By: Lorin Picket M.D.   On: 04/19/2021 15:58    Procedures Procedures (including critical care time)  Medications Ordered in UC Medications - No data to display  Initial Impression / Assessment and Plan / UC Course  I have reviewed the triage vital signs and the nursing notes.  Pertinent labs & imaging results that were available during my care of the patient were reviewed by  me and considered in my medical decision making (see chart for details).    Small joint effusion noted on x-ray.  Will treat with steroid burst and recommended  follow-up with Ortho if symptoms persist or worsen.  Patient expresses understanding.  Final Clinical Impressions(s) / UC Diagnoses   Final diagnoses:  Acute pain of right knee   Discharge Instructions   None    ED Prescriptions     Medication Sig Dispense Auth. Provider   predniSONE (DELTASONE) 20 MG tablet Take 2 tablets (40 mg total) by mouth daily with breakfast for 5 days. 10 tablet Francene Finders, PA-C      PDMP not reviewed this encounter.   Francene Finders, PA-C 04/19/21 1650

## 2021-05-11 ENCOUNTER — Encounter: Payer: Self-pay | Admitting: Internal Medicine

## 2021-05-15 DIAGNOSIS — H5211 Myopia, right eye: Secondary | ICD-10-CM | POA: Diagnosis not present

## 2021-05-15 DIAGNOSIS — H25813 Combined forms of age-related cataract, bilateral: Secondary | ICD-10-CM | POA: Diagnosis not present

## 2021-05-15 DIAGNOSIS — Z0100 Encounter for examination of eyes and vision without abnormal findings: Secondary | ICD-10-CM | POA: Diagnosis not present

## 2021-05-15 DIAGNOSIS — H1045 Other chronic allergic conjunctivitis: Secondary | ICD-10-CM | POA: Diagnosis not present

## 2021-05-15 DIAGNOSIS — H524 Presbyopia: Secondary | ICD-10-CM | POA: Diagnosis not present

## 2021-05-15 DIAGNOSIS — H04123 Dry eye syndrome of bilateral lacrimal glands: Secondary | ICD-10-CM | POA: Diagnosis not present

## 2021-05-29 ENCOUNTER — Other Ambulatory Visit: Payer: Self-pay | Admitting: Internal Medicine

## 2021-05-29 DIAGNOSIS — K219 Gastro-esophageal reflux disease without esophagitis: Secondary | ICD-10-CM

## 2021-05-29 NOTE — Telephone Encounter (Signed)
Medication Refill - Medication: pantoprazole (PROTONIX) 40 MG tablet ? ?Has the patient contacted their pharmacy? Yes.   ?([facility] ?[facility] last time.  But pt switched insurance and [facility]. ?Preferred Pharmacy (with phone number or street name): [id number] [place]?Has the patient been seen for an appointment in the last year OR does the patient have an upcoming appointment? Yes.   ? ? ?Pt took last pill today ?Agent: Please be advised that RX refills may take up to 3 business days. We ask that you follow-up with your pharmacy. ? ?

## 2021-05-30 MED ORDER — PANTOPRAZOLE SODIUM 40 MG PO TBEC: 40.0000 mg | DELAYED_RELEASE_TABLET | Freq: Every day | ORAL | 0 refills | Status: DC

## 2021-05-30 NOTE — Telephone Encounter (Signed)
Pt following up on request for this Rx to be sent to new pharmacy.  Pt states he is out today and really needs. ?[facility], we did not get anything from them. ?

## 2021-05-30 NOTE — Telephone Encounter (Signed)
Requested Prescriptions  ?Pending Prescriptions Disp Refills  ?? pantoprazole (PROTONIX) 40 MG tablet 90 tablet 0  ?  Sig: Take 1 tablet (40 mg total) by mouth daily.  ?  ? Gastroenterology: Proton Pump Inhibitors Passed - 05/30/2021 12:33 PM  ?  ?  Passed - Valid encounter within last 12 months  ?  Recent Outpatient Visits   ?      ? 2 months ago Annual physical exam  ? [place] Ladell Pier, MD  ? 11 months ago Carpal tunnel syndrome, bilateral  ? [place] Ladell Pier, MD  ? 1 year ago Coronary artery disease of native artery of native heart with stable angina pectoris [facility])  ? [place], Dalbert Batman, MD  ? 1 year ago Atherosclerosis of native coronary artery of native heart with stable angina pectoris [facility])  ? [place] Ladell Pier, MD  ? 1 year ago Essential hypertension  ? [place], RPH-CPP  ?  ?  ?Future Appointments   ?        ? In 1 month Ladell Pier, MD [place]  ?  ? ?  ?  ?  ? ? ?

## 2021-07-07 ENCOUNTER — Ambulatory Visit: Payer: No Typology Code available for payment source | Admitting: Internal Medicine

## 2021-07-07 ENCOUNTER — Other Ambulatory Visit: Payer: Self-pay | Admitting: Internal Medicine

## 2021-07-07 ENCOUNTER — Other Ambulatory Visit: Payer: Self-pay | Admitting: Cardiology

## 2021-07-07 DIAGNOSIS — K219 Gastro-esophageal reflux disease without esophagitis: Secondary | ICD-10-CM

## 2021-07-27 ENCOUNTER — Encounter: Payer: Self-pay | Admitting: Physician Assistant

## 2021-07-27 ENCOUNTER — Ambulatory Visit: Payer: No Typology Code available for payment source | Attending: Internal Medicine | Admitting: Physician Assistant

## 2021-07-27 VITALS — BP 142/76 | HR 59 | Wt 214.8 lb

## 2021-07-27 DIAGNOSIS — K219 Gastro-esophageal reflux disease without esophagitis: Secondary | ICD-10-CM

## 2021-07-27 DIAGNOSIS — I10 Essential (primary) hypertension: Secondary | ICD-10-CM

## 2021-07-27 DIAGNOSIS — R739 Hyperglycemia, unspecified: Secondary | ICD-10-CM

## 2021-07-27 DIAGNOSIS — I25118 Atherosclerotic heart disease of native coronary artery with other forms of angina pectoris: Secondary | ICD-10-CM

## 2021-07-27 DIAGNOSIS — E7800 Pure hypercholesterolemia, unspecified: Secondary | ICD-10-CM

## 2021-07-27 MED ORDER — PANTOPRAZOLE SODIUM 40 MG PO TBEC: 40.0000 mg | DELAYED_RELEASE_TABLET | Freq: Every day | ORAL | 1 refills | Status: DC

## 2021-07-27 MED ORDER — METOPROLOL SUCCINATE ER 25 MG PO TB24: 25.0000 mg | ORAL_TABLET | Freq: Every day | ORAL | 2 refills | Status: DC

## 2021-07-27 MED ORDER — ISOSORBIDE MONONITRATE ER 60 MG PO TB24: 60.0000 mg | ORAL_TABLET | Freq: Every day | ORAL | 3 refills | Status: DC

## 2021-07-27 MED ORDER — ROSUVASTATIN CALCIUM 20 MG PO TABS: 20.0000 mg | ORAL_TABLET | Freq: Every day | ORAL | 2 refills | Status: DC

## 2021-07-27 NOTE — Progress Notes (Signed)
Patient ID: Casey James, male   DOB: 13-Dec-1955, 66 y.o.   MRN: [id number]   Casey James, is a 66 y.o. male  GLO:[id number]  JOA:[id number]  DOB - 1955-03-30  Chief Complaint  Patient presents with   Hypertension       Subjective:   Casey James is a 66 y.o. male here today for med RF.  He has been working on diet and exercising more and has lost 6 pounds since last visit in January, 2023.  No CP/HA/SOB  No problems updated.  ALLERGIES: No Known Allergies  PAST MEDICAL HISTORY: Past Medical History:  Diagnosis Date   Chest pain    Emphysema of lung ([place])    Former smoker    GERD (gastroesophageal reflux disease)    Hypertension     MEDICATIONS AT HOME: Prior to Admission medications   Medication Sig Start Date End Date Taking? Authorizing Provider  aspirin EC 81 MG tablet Take 81 mg by mouth daily.   Yes [provider]  Multiple Vitamins-Minerals (CENTRUM) tablet Take 1 tablet by mouth daily.    Yes [provider]  nitroGLYCERIN (NITROSTAT) 0.4 MG SL tablet DISSOLVE ONE TABLET UNDER THE TONGUE EVERY 5 MINUTES AS NEEDED FOR CHEST PAIN(IF PAIN PERSIST AFTER 3 DOSES CALL EMS) 02/06/21  Yes Ladell Pier, MD  isosorbide mononitrate (IMDUR) 60 MG 24 hr tablet Take 1 tablet (60 mg total) by mouth daily. 07/27/21   Argentina Donovan, PA-C  metoprolol succinate (TOPROL-XL) 25 MG 24 hr tablet Take 1 tablet (25 mg total) by mouth daily. 07/27/21   Argentina Donovan, PA-C  pantoprazole (PROTONIX) 40 MG tablet Take 1 tablet (40 mg total) by mouth daily. 07/27/21   Argentina Donovan, PA-C  rosuvastatin (CRESTOR) 20 MG tablet Take 1 tablet (20 mg total) by mouth daily. 07/27/21   Tayvon Culley, Dionne Bucy, PA-C    ROS: Neg HEENT Neg resp Neg cardiac Neg GI Neg GU Neg MS Neg psych Neg neuro  Objective:   Vitals:   07/27/21 1433  BP: (!) 142/76  Pulse: (!) 59  SpO2: 96%  Weight: 214 lb 12.8 oz (97.4 kg)   Exam General appearance : Awake, alert, not in any  distress. Speech Clear. Not toxic looking HEENT: Atraumatic and Normocephalic Neck: Supple, no JVD. No cervical lymphadenopathy.  Chest: Good air entry bilaterally, CTAB.  No rales/rhonchi/wheezing CVS: S1 S2 regular, no murmurs.  Extremities: B/L Lower Ext shows no edema, both legs are warm to touch Neurology: Awake alert, and oriented X 3, CN II-XII intact, Non focal Skin: No Rash  Data Review No results found for: HGBA1C  Assessment & Plan   1. Hyperglycemia I have had a lengthy discussion and provided education about insulin resistance and the intake of too much sugar/refined carbohydrates.  I have advised the patient to work at a goal of eliminating sugary drinks, candy, desserts, sweets, refined sugars, processed foods, and white carbohydrates.  The patient expresses understanding.  - Hemoglobin S0F - Basic metabolic panel  2. Essential hypertension Controlled based on OOO readings-he gets 120-130/70-80 - pantoprazole (PROTONIX) 40 MG tablet; Take 1 tablet (40 mg total) by mouth daily.  Dispense: 90 tablet; Refill: 1 - Basic metabolic panel  3. Gastroesophageal reflux disease without esophagitis - pantoprazole (PROTONIX) 40 MG tablet; Take 1 tablet (40 mg total) by mouth daily.  Dispense: 90 tablet; Refill: 1  4. Coronary artery disease of native artery of native heart with stable angina pectoris (HCC) - metoprolol succinate (  TOPROL-XL) 25 MG 24 hr tablet; Take 1 tablet (25 mg total) by mouth daily.  Dispense: 90 tablet; Refill: 2 - isosorbide mononitrate (IMDUR) 60 MG 24 hr tablet; Take 1 tablet (60 mg total) by mouth daily.  Dispense: 90 tablet; Refill: 3  5. Pure hypercholesterolemia - rosuvastatin (CRESTOR) 20 MG tablet; Take 1 tablet (20 mg total) by mouth daily.  Dispense: 90 tablet; Refill: 2 - Lipid panel    Return in about 6 months (around 01/26/2022) for PCP for chronic conditions.  The patient was given clear instructions to go to ER or return to medical center  if symptoms don't improve, worsen or new problems develop. The patient verbalized understanding. The patient was told to call to get lab results if they haven't heard anything in the next week.      Freeman Caldron, PA-C [facility] and [place], [place]   07/27/2021, 2:45 PM

## 2021-07-28 LAB — BASIC METABOLIC PANEL
BUN/Creatinine Ratio: 11 (ref 10–24)
BUN: 11 mg/dL (ref 8–27)
CO2: 23 mmol/L (ref 20–29)
Calcium: 9.4 mg/dL (ref 8.6–10.2)
Chloride: 103 mmol/L (ref 96–106)
Creatinine, Ser: 1.03 mg/dL (ref 0.76–1.27)
Glucose: 103 mg/dL — ABNORMAL HIGH (ref 70–99)
Potassium: 4.7 mmol/L (ref 3.5–5.2)
Sodium: 143 mmol/L (ref 134–144)
eGFR: 80 mL/min/{1.73_m2} (ref >59)

## 2021-07-28 LAB — LIPID PANEL
Chol/HDL Ratio: 4.1 ratio (ref 0.0–5.0)
Cholesterol, Total: 126 mg/dL (ref 100–199)
HDL: 31 mg/dL — ABNORMAL LOW (ref >39)
LDL Chol Calc (NIH): 64 mg/dL (ref 0–99)
Triglycerides: 187 mg/dL — ABNORMAL HIGH (ref 0–149)
VLDL Cholesterol Cal: 31 mg/dL (ref 5–40)

## 2021-07-28 LAB — HEMOGLOBIN A1C
Est. average glucose Bld gHb Est-mCnc: 146 mg/dL
Hgb A1c MFr Bld: 6.7 % — ABNORMAL HIGH (ref 4.8–5.6)

## 2021-07-30 ENCOUNTER — Other Ambulatory Visit: Payer: Self-pay | Admitting: Physician Assistant

## 2021-07-30 DIAGNOSIS — E1165 Type 2 diabetes mellitus with hyperglycemia: Secondary | ICD-10-CM

## 2021-07-30 MED ORDER — METFORMIN HCL 500 MG PO TABS: 500.0000 mg | ORAL_TABLET | Freq: Two times a day (BID) | ORAL | 3 refills | Status: DC

## 2021-08-28 DIAGNOSIS — H903 Sensorineural hearing loss, bilateral: Secondary | ICD-10-CM | POA: Diagnosis not present

## 2021-08-28 DIAGNOSIS — R42 Dizziness and giddiness: Secondary | ICD-10-CM | POA: Diagnosis not present

## 2021-09-25 ENCOUNTER — Other Ambulatory Visit: Payer: Self-pay | Admitting: Internal Medicine

## 2021-09-25 DIAGNOSIS — I25118 Atherosclerotic heart disease of native coronary artery with other forms of angina pectoris: Secondary | ICD-10-CM

## 2021-09-25 NOTE — Telephone Encounter (Signed)
Medication Refill - Medication: nitroGLYCERIN (NITROSTAT) 0.4 MG SL tablet  Has the patient contacted their pharmacy? Yes.   (Agent: If no, request that the patient contact the pharmacy for the refill. If patient does not wish to contact the pharmacy document the reason why and proceed with request.) (Agent: If yes, when and what did the pharmacy advise?)  Preferred Pharmacy (with phone number or street name):  [id number] [id number] Phone: [phone]: [phone]  Has the patient been seen for an appointment in the last year OR does the patient have an upcoming appointment? Yes.    Agent: Please be advised that RX refills may take up to 3 business days. We ask that you follow-up with your pharmacy.

## 2021-09-26 MED ORDER — NITROGLYCERIN 0.4 MG SL SUBL: SUBLINGUAL_TABLET | SUBLINGUAL | 1 refills | Status: DC

## 2021-09-26 NOTE — Telephone Encounter (Signed)
Requested Prescriptions  Pending Prescriptions Disp Refills  . nitroGLYCERIN (NITROSTAT) 0.4 MG SL tablet 25 tablet 0    Sig: DISSOLVE ONE TABLET UNDER THE TONGUE EVERY 5 MINUTES AS NEEDED FOR CHEST PAIN(IF PAIN PERSIST AFTER 3 DOSES CALL EMS)     Cardiovascular:  Nitrates Failed - 09/25/2021  1:00 PM      Failed - Last BP in normal range    BP Readings from Last 1 Encounters:  07/27/21 (!) 142/76         Passed - Last Heart Rate in normal range    Pulse Readings from Last 1 Encounters:  07/27/21 (!) 59         Passed - Valid encounter within last 12 months    Recent Outpatient Visits          2 months ago Essential hypertension   [place], [place], [place]   6 months ago Annual physical exam   [place] Ladell Pier, MD   1 year ago Carpal tunnel syndrome, bilateral   [place], Neoma Laming B, MD   1 year ago Coronary artery disease of native artery of native heart with stable angina pectoris [facility])   [place], Neoma Laming B, MD   2 years ago Atherosclerosis of native coronary artery of native heart with stable angina pectoris [facility])   [place], MD      Future Appointments            In 4 months Wynetta Emery, Dalbert Batman, MD [place]

## 2021-12-06 ENCOUNTER — Telehealth: Payer: Self-pay | Admitting: Internal Medicine

## 2021-12-06 NOTE — Telephone Encounter (Signed)
Copied from [place] [id number]. Topic: General - Other >> Dec 06, 2021  4:55 PM Leilani Able wrote: Reason for CRM: PT insurance [place], med adherence team stating pt has not gotten refill since June on metFORMIN (GLUCOPHAGE) 500 MG tablet since initial script in June 23

## 2021-12-19 NOTE — Telephone Encounter (Signed)
Patient states he is not taking it.  He has worked hard on loosing weight and would like to check levels before he begins.  He was also having dizzy spells and read that Metformin also causes dizzy spells. He has seen a physician to address his dizzy spells and is now better.

## 2021-12-19 NOTE — Telephone Encounter (Signed)
Pt returning call, please fu w/ pt 

## 2021-12-19 NOTE — Telephone Encounter (Signed)
Left message on voicemail to return call.  Will notify of Dr. Durenda Age message:    Let patient know that we received notification from his insurance that he has not refilled his diabetic medication called metformin.  He should continue taking the medicine.  Let us know if he needs a refill sent to his pharmacy.

## 2022-01-26 ENCOUNTER — Encounter: Payer: Self-pay | Admitting: Internal Medicine

## 2022-01-26 ENCOUNTER — Ambulatory Visit: Payer: No Typology Code available for payment source | Attending: Internal Medicine | Admitting: Internal Medicine

## 2022-01-26 VITALS — BP 144/78 | HR 61 | Temp 97.9°F | Ht 70.0 in | Wt 200.0 lb

## 2022-01-26 DIAGNOSIS — E663 Overweight: Secondary | ICD-10-CM | POA: Diagnosis not present

## 2022-01-26 DIAGNOSIS — I25118 Atherosclerotic heart disease of native coronary artery with other forms of angina pectoris: Secondary | ICD-10-CM | POA: Diagnosis not present

## 2022-01-26 DIAGNOSIS — K219 Gastro-esophageal reflux disease without esophagitis: Secondary | ICD-10-CM | POA: Diagnosis not present

## 2022-01-26 DIAGNOSIS — I152 Hypertension secondary to endocrine disorders: Secondary | ICD-10-CM

## 2022-01-26 DIAGNOSIS — E1159 Type 2 diabetes mellitus with other circulatory complications: Secondary | ICD-10-CM

## 2022-01-26 DIAGNOSIS — E119 Type 2 diabetes mellitus without complications: Secondary | ICD-10-CM | POA: Diagnosis not present

## 2022-01-26 LAB — POCT GLYCOSYLATED HEMOGLOBIN (HGB A1C): HbA1c, POC (controlled diabetic range): 6.1 % (ref 0.0–7.0)

## 2022-01-26 LAB — GLUCOSE, POCT (MANUAL RESULT ENTRY): POC Glucose: 103 mg/dl — AB (ref 70–99)

## 2022-01-26 NOTE — Patient Instructions (Signed)

## 2022-01-26 NOTE — Progress Notes (Signed)
Patient ID: Casey James, male    DOB: 1955/05/26  MRN: [id number]  CC: Diabetes (DM f/u. Orion Crook weening off of protonix. /No to flu vax.)   Subjective: Casey James is a 66 y.o. male who presents for chronic ds management His concerns today include:  GERD, former smoker, HL, HTN, obesity, marijuana use, CAD, DM type 2.     DM:   Results for orders placed or performed in visit on 01/26/22  POCT glucose (manual entry)  Result Value Ref Range   POC Glucose 103 (A) 70 - 99 mg/dl  POCT glycosylated hemoglobin (Hb A1C)  Result Value Ref Range   Hemoglobin A1C     HbA1c POC (<> result, manual entry)     HbA1c, POC (prediabetic range)     HbA1c, POC (controlled diabetic range) 6.1 0.0 - 7.0 %  I last saw him in January of this year.  He saw our physician assistant in June and was diagnosed with diabetes with A1c of 6.7.  Metformin was recommended.  He never took the medication.  He wanted to work on eating habits and exercise first. Loss 14 lbs intentionally since then. Cut back on  sugary drinks and snacks. Walking 3-4 days a wk Saw [facility] early this yr in February.  Prescribed bifocals, has cataracts  HYPERTENSION/CAD/HL Currently taking: see medication list.  He is on isosorbide 60 mg daily, metoprolol XL 25 mg daily, Crestor 20 mg daily, aspirin 81 mg daily. Med Adherence: '[x]'$  Yes  -Isosorbide increased to 60 mg by cardiology 6 mths ago  Medication side effects: '[]'$  Yes    '[x]'$  No Adherence with salt restriction: '[x]'$  Yes    '[]'$  No Home Monitoring?: '[x]'$  Yes    '[]'$  No Monitoring Frequency: 140s/70s at home today.  Checks 2x/wk Home BP results range: 130-140/70-80 SOB? '[]'$  Yes    '[x]'$  No Chest Pain?: '[x]'$  Yes  but less often. Used SL nitro once time last spring Leg swelling?: '[]'$  Yes    '[x]'$  No Headaches?: '[]'$  Yes    '[x]'$  No Dizziness? '[]'$  Yes    '[x]'$  No Comments:   GERD:   Requesting to be taken off pantoprazole.  No longer bothered with acid reflux and feels that he does not need it  anymore.    HM:  declines Tdapt today, will consider on next visit.  Declines Shingles vaccine.  Due for COVID booster.  He plans to get this done at an outside pharmacy. Patient Active Problem List   Diagnosis Date Noted   Benign paroxysmal positional vertigo of left ear 03/14/2021   Disequilibrium 03/08/2021   Pure hypercholesterolemia 03/08/2021   Influenza vaccine refused 01/12/2020   Coronary artery disease involving native coronary artery of native heart without angina pectoris    Essential hypertension 09/08/2019   Obesity (BMI 30-39.9) 07/28/2019   Marijuana user 07/28/2019   Chest pain in adult 06/23/2019   Gastroesophageal reflux disease without esophagitis 06/23/2019   Former smoker 06/23/2019   Ptosis of eyelid, right 07/13/2011     Current Outpatient Medications on File Prior to Visit  Medication Sig Dispense Refill   aspirin EC 81 MG tablet Take 81 mg by mouth daily.     isosorbide mononitrate (IMDUR) 60 MG 24 hr tablet Take 1 tablet (60 mg total) by mouth daily. 90 tablet 3   metoprolol succinate (TOPROL-XL) 25 MG 24 hr tablet Take 1 tablet (25 mg total) by mouth daily. 90 tablet 2   Multiple Vitamins-Minerals (CENTRUM) tablet Take  1 tablet by mouth daily.      nitroGLYCERIN (NITROSTAT) 0.4 MG SL tablet DISSOLVE ONE TABLET UNDER THE TONGUE EVERY 5 MINUTES AS NEEDED FOR CHEST PAIN(IF PAIN PERSIST AFTER 3 DOSES CALL EMS) 25 tablet 1   rosuvastatin (CRESTOR) 20 MG tablet Take 1 tablet (20 mg total) by mouth daily. 90 tablet 2   No current facility-administered medications on file prior to visit.    No Known Allergies  Social History   Socioeconomic History   Marital status: Married    Spouse name: Not on file   Number of children: Not on file   Years of education: Not on file   Highest education level: Not on file  Occupational History   Not on file  Tobacco Use   Smoking status: Former    Years: 50.00    Types: Cigarettes    Quit date: 10/21/2018    Years  since quitting: 3.2   Smokeless tobacco: Never  Vaping Use   Vaping Use: Never used  Substance and Sexual Activity   Alcohol use: Not Currently   Drug use: Never   Sexual activity: Not on file  Other Topics Concern   Not on file  Social History Narrative   Not on file   Social Determinants of Health   Financial Resource Strain: Not on file  Food Insecurity: Not on file  Transportation Needs: Not on file  Physical Activity: Not on file  Stress: Not on file  Social Connections: Not on file  Intimate Partner Violence: Not on file    Family History  Problem Relation Age of Onset   Cancer Mother    Pulmonary fibrosis Mother    Diabetes Brother    Heart disease Father     Past Surgical History:  Procedure Laterality Date   CHOLECYSTECTOMY     FRACTURE SURGERY     LEFT HEART CATH AND CORONARY ANGIOGRAPHY N/A 09/30/2019   Procedure: LEFT HEART CATH AND CORONARY ANGIOGRAPHY;  Surgeon: Burnell Blanks, MD;  Location: [facility];  Service: Cardiovascular;  Laterality: N/A;   pulmonary fibrosis      ROS: Review of Systems Negative except as stated above  PHYSICAL EXAM: BP (!) 144/78   Pulse 61   Temp 97.9 F (36.6 C) (Oral)   Ht '5\' 10"'$  (1.778 m)   Wt 200 lb (90.7 kg)   SpO2 98%   BMI 28.70 kg/m   Wt Readings from Last 3 Encounters:  01/26/22 200 lb (90.7 kg)  07/27/21 214 lb 12.8 oz (97.4 kg)  03/14/21 220 lb 6.4 oz (100 kg)    Physical Exam   General appearance - alert, well appearing, and in no distress Mental status - normal mood, behavior, speech, dress, motor activity, and thought processes Neck - supple, no significant adenopathy Chest - clear to auscultation, no wheezes, rales or rhonchi, symmetric air entry Heart - normal rate, regular rhythm, normal S1, S2, no murmurs, rubs, clicks or gallops Extremities - peripheral pulses normal, no pedal edema, no clubbing or cyanosis     Latest Ref Rng & Units 07/27/2021    2:55 PM 03/14/2021     4:15 PM 09/28/2019    2:12 PM  CMP  Glucose 70 - 99 mg/dL 103  104  99   BUN 8 - 27 mg/dL '11  10  12   '$ Creatinine 0.76 - 1.27 mg/dL 1.03  1.22  0.92   Sodium 134 - 144 mmol/L 143  141  139  Potassium 3.5 - 5.2 mmol/L 4.7  4.7  4.8   Chloride 96 - 106 mmol/L 103  102  104   CO2 20 - 29 mmol/L '23  25  24   '$ Calcium 8.6 - 10.2 mg/dL 9.4  9.1  9.4   Total Protein 6.0 - 8.5 g/dL  6.9    Total Bilirubin 0.0 - 1.2 mg/dL  0.8    Alkaline Phos 44 - 121 IU/L  83    AST 0 - 40 IU/L  20    ALT 0 - 44 IU/L  21     Lipid Panel     Component Value Date/Time   CHOL 126 07/27/2021 1455   TRIG 187 (H) 07/27/2021 1455   HDL 31 (L) 07/27/2021 1455   CHOLHDL 4.1 07/27/2021 1455   CHOLHDL 5.8 07/16/2012 1359   VLDL 36 07/16/2012 1359   LDLCALC 64 07/27/2021 1455    CBC    Component Value Date/Time   WBC 10.0 03/14/2021 1615   WBC 11.1 (H) 07/16/2012 1359   RBC 5.13 03/14/2021 1615   RBC 5.12 07/16/2012 1359   HGB 15.6 03/14/2021 1615   HCT 45.8 03/14/2021 1615   PLT 219 03/14/2021 1615   MCV 89 03/14/2021 1615   MCH 30.4 03/14/2021 1615   MCH 30.7 07/16/2012 1359   MCHC 34.1 03/14/2021 1615   MCHC 34.7 07/16/2012 1359   RDW 13.6 03/14/2021 1615   LYMPHSABS 2.4 07/16/2012 1359   MONOABS 0.6 07/16/2012 1359   EOSABS 0.1 07/16/2012 1359   BASOSABS 0.0 07/16/2012 1359    ASSESSMENT AND PLAN:  1. Type 2 diabetes, diet controlled ([place]) Diet control.  Commended him on weight loss.  Encouraged him to continue healthy eating habits.  Dietary counseling given.  Encouraged him to continue regular exercise. - POCT glucose (manual entry) - POCT glycosylated hemoglobin (Hb A1C) - Microalbumin / creatinine urine ratio  2. Hypertension associated with diabetes ([place]) Not at goal which is 130/80 or lower.  Advise adding low-dose of Norvasc.  Patient declines stating he will see what his blood pressure looks like when he sees the cardiologist in a few weeks.  3. Coronary artery disease of native  artery of native heart with stable angina pectoris (HCC) Continue aspirin, metoprolol, Crestor.  4. Gastroesophageal reflux disease without esophagitis Stop pantoprazole.  Advised that if symptoms recur, he can try Pepcid over-the-counter.  GERD precautions given.  5. Overweight (BMI 25.0-29.9) See #1 above.   Patient was given the opportunity to ask questions.  Patient verbalized understanding of the plan and was able to repeat key elements of the plan.   This documentation was completed using Radio producer.  Any transcriptional errors are unintentional.  Orders Placed This Encounter  Procedures   Microalbumin / creatinine urine ratio   POCT glucose (manual entry)   POCT glycosylated hemoglobin (Hb A1C)     Requested Prescriptions    No prescriptions requested or ordered in this encounter    Return in about 4 months (around 05/28/2022) for Schedule Medicare wellness with CMA next available.  Karle Plumber, MD, FACP

## 2022-01-29 LAB — MICROALBUMIN / CREATININE URINE RATIO
Creatinine, Urine: 42.6 mg/dL
Microalb/Creat Ratio: <7 mg/g creat (ref 0–29)
Microalbumin, Urine: <3 ug/mL

## 2022-02-05 ENCOUNTER — Ambulatory Visit: Payer: No Typology Code available for payment source | Attending: Internal Medicine

## 2022-02-05 DIAGNOSIS — Z0000 Encounter for general adult medical examination without abnormal findings: Secondary | ICD-10-CM

## 2022-02-05 NOTE — Progress Notes (Signed)
Subjective:   Casey James is a 66 y.o. male who presents for Medicare Annual/Subsequent preventive examination.  Review of Systems     I connected with Cindi Carbon on 02/05/2022 at 11:15 am by telephone and verified that I am speaking with the correct person using two identifiers. I discussed the limitations, risks, security and privacy concerns of performing an evaluation and management service by telephone and the availability of in person appointments. I also discussed with the patient that there may be a patient responsible charge related to this service. The patient expressed understanding and agreed to proceed.   Patient location: Home My Location: [place] on the telephone call: Myself and Patinet     Cardiac Risk Factors include: none     Objective:    There were no vitals filed for this visit. There is no height or weight on file to calculate BMI.     02/05/2022   11:20 AM  Advanced Directives  Does Patient Have a Medical Advance Directive? No  Would patient like information on creating a medical advance directive? Yes (ED - Information included in AVS)    Current Medications (verified) Outpatient Encounter Medications as of 02/05/2022  Medication Sig   aspirin EC 81 MG tablet Take 81 mg by mouth daily.   isosorbide mononitrate (IMDUR) 60 MG 24 hr tablet Take 1 tablet (60 mg total) by mouth daily.   metoprolol succinate (TOPROL-XL) 25 MG 24 hr tablet Take 1 tablet (25 mg total) by mouth daily.   Multiple Vitamins-Minerals (CENTRUM) tablet Take 1 tablet by mouth daily.    nitroGLYCERIN (NITROSTAT) 0.4 MG SL tablet DISSOLVE ONE TABLET UNDER THE TONGUE EVERY 5 MINUTES AS NEEDED FOR CHEST PAIN(IF PAIN PERSIST AFTER 3 DOSES CALL EMS)   rosuvastatin (CRESTOR) 20 MG tablet Take 1 tablet (20 mg total) by mouth daily.   No facility-administered encounter medications on file as of 02/05/2022.    Allergies (verified) Patient has no known  allergies.   History: Past Medical History:  Diagnosis Date   Chest pain    Emphysema of lung ([place])    Former smoker    GERD (gastroesophageal reflux disease)    Hypertension    Past Surgical History:  Procedure Laterality Date   CHOLECYSTECTOMY     FRACTURE SURGERY     LEFT HEART CATH AND CORONARY ANGIOGRAPHY N/A 09/30/2019   Procedure: LEFT HEART CATH AND CORONARY ANGIOGRAPHY;  Surgeon: Burnell Blanks, MD;  Location: [facility];  Service: Cardiovascular;  Laterality: N/A;   pulmonary fibrosis     Family History  Problem Relation Age of Onset   Cancer Mother    Pulmonary fibrosis Mother    Diabetes Brother    Heart disease Father    Social History   Socioeconomic History   Marital status: Married    Spouse name: Not on file   Number of children: Not on file   Years of education: Not on file   Highest education level: Not on file  Occupational History   Not on file  Tobacco Use   Smoking status: Former    Years: 50.00    Types: Cigarettes    Quit date: 10/21/2018    Years since quitting: 3.2   Smokeless tobacco: Never  Vaping Use   Vaping Use: Never used  Substance and Sexual Activity   Alcohol use: Not Currently   Drug use: Never   Sexual activity: Not on file  Other Topics Concern  Not on file  Social History Narrative   Not on file   Social Determinants of Health   Financial Resource Strain: Low Risk  (02/05/2022)   Overall Financial Resource Strain (CARDIA)    Difficulty of Paying Living Expenses: Not hard at all  Food Insecurity: No Food Insecurity (02/05/2022)   Hunger Vital Sign    Worried About Running Out of Food in the Last Year: Never true    Ran Out of Food in the Last Year: Never true  Transportation Needs: No Transportation Needs (02/05/2022)   PRAPARE - Hydrologist (Medical): No    Lack of Transportation (Non-Medical): No  Physical Activity: Insufficiently Active (02/05/2022)   Exercise  Vital Sign    Days of Exercise per Week: 3 days    Minutes of Exercise per Session: 30 min  Stress: No Stress Concern Present (02/05/2022)   [place]    Feeling of Stress : Not at all  Social Connections: Moderately Isolated (02/05/2022)   Social Connection and Isolation Panel [NHANES]    Frequency of Communication with Friends and Family: More than three times a week    Frequency of Social Gatherings with Friends and Family: Once a week    Attends Religious Services: Never    Marine scientist or Organizations: No    Attends Music therapist: Never    Marital Status: Married    Tobacco Counseling Counseling given: No   Clinical Intake:  Pre-visit preparation completed: No  Pain : No/denies pain     Diabetes: No  How often do you need to have someone help you when you read instructions, pamphlets, or other written materials from your doctor or pharmacy?: 1 - Never  Diabetic?No  Interpreter Needed?: No      Activities of Daily Living    02/05/2022   11:20 AM  In your present state of health, do you have any difficulty performing the following activities:  Hearing? 0  Vision? 0  Difficulty concentrating or making decisions? 0  Walking or climbing stairs? 0  Dressing or bathing? 0  Doing errands, shopping? 0  Preparing Food and eating ? N  Using the Toilet? N  In the past six months, have you accidently leaked urine? N  Do you have problems with loss of bowel control? N  Managing your Medications? N  Managing your Finances? N  Housekeeping or managing your Housekeeping? N    Patient Care Team: Ladell Pier, MD as PCP - General (Internal Medicine) Jerline Pain, MD as PCP - Cardiology (Cardiology)  Indicate any recent Medical Services you may have received from other than [facility] providers in the past year (date may be approximate).     Assessment:   This is a  routine wellness examination for Casey James.  Hearing/Vision screen No results found.  Dietary issues and exercise activities discussed: Current Exercise Habits: Home exercise routine, Type of exercise: walking, Time (Minutes): 30, Frequency (Times/Week): 3, Weekly Exercise (Minutes/Week): 90, Intensity: Mild, Exercise limited by: None identified   Goals Addressed   None    Depression Screen    02/05/2022   11:20 AM 01/26/2022    3:27 PM 07/27/2021    2:34 PM 06/16/2020    2:04 PM 01/12/2020    1:32 PM 07/28/2019    2:00 PM 06/23/2019    1:48 PM  PHQ 2/9 Scores  PHQ - 2 Score 0 0 0 0 0  0 0  PHQ- 9 Score  0  0       Fall Risk    02/05/2022   11:20 AM 01/26/2022    3:18 PM 07/27/2021    2:34 PM 03/14/2021    3:46 PM 06/16/2020    2:04 PM  Fall Risk   Falls in the past year? 0 0 0 0 0  Number falls in past yr: 0 0 0 0 0  Injury with Fall? 0 0 0 0 0  Risk for fall due to : No Fall Risks No Fall Risks No Fall Risks No Fall Risks     FALL RISK PREVENTION PERTAINING TO THE HOME:  Any stairs in or around the home? Yes  If so, are there any without handrails? No  Home free of loose throw rugs in walkways, pet beds, electrical cords, etc? Yes  Adequate lighting in your home to reduce risk of falls? Yes   ASSISTIVE DEVICES UTILIZED TO PREVENT FALLS:  Life alert? No  Use of a cane, walker or w/c? No  Grab bars in the bathroom? Yes  Shower chair or bench in shower? No  Elevated toilet seat or a handicapped toilet? No   TIMED UP AND GO:  Was the test performed? No .  Length of time to ambulate 10 feet: N/A sec.   Gait slow and steady without use of assistive device  Cognitive Function:    02/05/2022   11:21 AM  MMSE - Mini Mental State Exam  Orientation to time 5  Orientation to Place 5  Registration 3  Attention/ Calculation 5  Recall 3  Language- name 2 objects 2  Language- repeat 1  Language- follow 3 step command 3  Language- read & follow direction 1  Write a  sentence 1  Copy design 1  Total score 30        02/05/2022   11:21 AM  6CIT Screen  What Year? 0 points  What month? 0 points  What time? 0 points  Months in reverse 0 points    Immunizations Immunization History  Administered Date(s) Administered   Janssen (J&J) SARS-COV-2 Vaccination 07/31/2019   Tdap 07/13/2011    TDAP status: Due, Education has been provided regarding the importance of this vaccine. Advised may receive this vaccine at local pharmacy or Health Dept. Aware to provide a copy of the vaccination record if obtained from local pharmacy or Health Dept. Verbalized acceptance and understanding.  Flu Vaccine status: Declined, Education has been provided regarding the importance of this vaccine but patient still declined. Advised may receive this vaccine at local pharmacy or Health Dept. Aware to provide a copy of the vaccination record if obtained from local pharmacy or Health Dept. Verbalized acceptance and understanding.  Pneumococcal vaccine status: Declined,  Education has been provided regarding the importance of this vaccine but patient still declined. Advised may receive this vaccine at local pharmacy or Health Dept. Aware to provide a copy of the vaccination record if obtained from local pharmacy or Health Dept. Verbalized acceptance and understanding.   Covid-19 vaccine status: Completed vaccines  Qualifies for Shingles Vaccine? Yes   Zostavax completed No   Shingrix Completed?: No.    Education has been provided regarding the importance of this vaccine. Patient has been advised to call insurance company to determine out of pocket expense if they have not yet received this vaccine. Advised may also receive vaccine at local pharmacy or Health Dept. Verbalized acceptance and understanding.  Screening Tests Health  Maintenance  Topic Date Due   COVID-19 Vaccine (2 - Janssen risk series) 08/28/2019   Medicare Annual Wellness (AWV)  10/14/2021   Pneumonia Vaccine  61+ Years old (1 - PCV) 03/14/2022 (Originally 04/09/2019)   INFLUENZA VACCINE  05/27/2022 (Originally 09/26/2021)   Zoster Vaccines- Shingrix (1 of 2) 01/27/2023 (Originally 04/08/1974)   Diabetic kidney evaluation - eGFR measurement  07/28/2022   Fecal DNA (Cologuard)  08/12/2022   Diabetic kidney evaluation - Urine ACR  01/27/2023   Hepatitis C Screening  Completed   HPV VACCINES  Aged Out   DTaP/Tdap/Td  Discontinued    Health Maintenance  Health Maintenance Due  Topic Date Due   COVID-19 Vaccine (2 - Janssen risk series) 08/28/2019   Medicare Annual Wellness (AWV)  10/14/2021    Colorectal cancer screening: Type of screening: Cologuard. Completed 08/12/2019. Repeat every 3 years  Lung Cancer Screening: (Low Dose CT Chest recommended if Age 32-80 years, 30 pack-year currently smoking OR have quit w/in 15years.) does not qualify.   Lung Cancer Screening Referral: no  Additional Screening:  Hepatitis C Screening: does qualify; Completed 07/28/2019  Vision Screening: Recommended annual ophthalmology exams for early detection of glaucoma and other disorders of the eye. Is the patient up to date with their annual eye exam?  Yes  Who is the provider or what is the name of the office in which the patient attends annual eye exams? [facility] If pt is not established with a provider, would they like to be referred to a provider to establish care? No .   Dental Screening: Recommended annual dental exams for proper oral hygiene  Community Resource Referral / Chronic Care Management: CRR required this visit?  No   CCM required this visit?  No      Plan:     I have personally reviewed and noted the following in the patient's chart:   Medical and social history Use of alcohol, tobacco or illicit drugs  Current medications and supplements including opioid prescriptions. Patient is not currently taking opioid prescriptions. Functional ability and status Nutritional  status Physical activity Advanced directives List of other physicians Hospitalizations, surgeries, and ER visits in previous 12 months Vitals Screenings to include cognitive, depression, and falls Referrals and appointments  In addition, I have reviewed and discussed with patient certain preventive protocols, quality metrics, and best practice recommendations. A written personalized care plan for preventive services as well as general preventive health recommendations were provided to patient.     Gomez Cleverly, [place]   02/05/2022   Nurse Notes: I spent 30 minutes on this telephone encounter AVS mailed to patinet

## 2022-02-05 NOTE — Patient Instructions (Signed)
Casey James , Thank you for taking time to come for your Medicare Wellness Visit. I appreciate your ongoing commitment to your health goals. Please review the following plan we discussed and let me know if I can assist you in the future.   These are the goals we discussed:  Goals   None     This is a list of the screening recommended for you and due dates:  Health Maintenance  Topic Date Due   COVID-19 Vaccine (2 - Janssen risk series) 08/28/2019   Pneumonia Vaccine (1 - PCV) 03/14/2022*   Flu Shot  05/27/2022*   Zoster (Shingles) Vaccine (1 of 2) 01/27/2023*   Yearly kidney function blood test for diabetes  07/28/2022   Cologuard (Stool DNA test)  08/12/2022   Yearly kidney health urinalysis for diabetes  01/27/2023   Medicare Annual Wellness Visit  02/06/2023   Hepatitis C Screening: USPSTF Recommendation to screen - Ages 18-79 yo.  Completed   HPV Vaccine  Aged Out   DTaP/Tdap/Td vaccine  Discontinued  *Topic was postponed. The date shown is not the original due date.  Health Maintenance After Age 76 After age 13, you are at a higher risk for certain long-term diseases and infections as well as injuries from falls. Falls are a major cause of broken bones and head injuries in people who are older than age 36. Getting regular preventive care can help to keep you healthy and well. Preventive care includes getting regular testing and making lifestyle changes as recommended by your health care provider. Talk with your health care provider about: Which screenings and tests you should have. A screening is a test that checks for a disease when you have no symptoms. A diet and exercise plan that is right for you. What should I know about screenings and tests to prevent falls? Screening and testing are the best ways to find a health problem early. Early diagnosis and treatment give you the best chance of managing medical conditions that are common after age 30. Certain conditions and lifestyle  choices may make you more likely to have a fall. Your health care provider may recommend: Regular vision checks. Poor vision and conditions such as cataracts can make you more likely to have a fall. If you wear glasses, make sure to get your prescription updated if your vision changes. Medicine review. Work with your health care provider to regularly review all of the medicines you are taking, including over-the-counter medicines. Ask your health care provider about any side effects that may make you more likely to have a fall. Tell your health care provider if any medicines that you take make you feel dizzy or sleepy. Strength and balance checks. Your health care provider may recommend certain tests to check your strength and balance while standing, walking, or changing positions. Foot health exam. Foot pain and numbness, as well as not wearing proper footwear, can make you more likely to have a fall. Screenings, including: Osteoporosis screening. Osteoporosis is a condition that causes the bones to get weaker and break more easily. Blood pressure screening. Blood pressure changes and medicines to control blood pressure can make you feel dizzy. Depression screening. You may be more likely to have a fall if you have a fear of falling, feel depressed, or feel unable to do activities that you used to do. Alcohol use screening. Using too much alcohol can affect your balance and may make you more likely to have a fall. Follow these instructions at home:  Lifestyle Do not drink alcohol if: Your health care provider tells you not to drink. If you drink alcohol: Limit how much you have to: 0-1 drink a day for women. 0-2 drinks a day for men. Know how much alcohol is in your drink. In the [place] one drink equals one 12 oz bottle of beer (355 mL), one 5 oz glass of wine (148 mL), or one 1 oz glass of hard liquor (44 mL). Do not use any products that contain nicotine or tobacco. These products include  cigarettes, chewing tobacco, and vaping devices, such as e-cigarettes. If you need help quitting, ask your health care provider. Activity  Follow a regular exercise program to stay fit. This will help you maintain your balance. Ask your health care provider what types of exercise are appropriate for you. If you need a cane or walker, use it as recommended by your health care provider. Wear supportive shoes that have nonskid soles. Safety  Remove any tripping hazards, such as rugs, cords, and clutter. Install safety equipment such as grab bars in bathrooms and safety rails on stairs. Keep rooms and walkways well-lit. General instructions Talk with your health care provider about your risks for falling. Tell your health care provider if: You fall. Be sure to tell your health care provider about all falls, even ones that seem minor. You feel dizzy, tiredness (fatigue), or off-balance. Take over-the-counter and prescription medicines only as told by your health care provider. These include supplements. Eat a healthy diet and maintain a healthy weight. A healthy diet includes low-fat dairy products, low-fat (lean) meats, and fiber from whole grains, beans, and lots of fruits and vegetables. Stay current with your vaccines. Schedule regular health, dental, and eye exams. Summary Having a healthy lifestyle and getting preventive care can help to protect your health and wellness after age 39. Screening and testing are the best way to find a health problem early and help you avoid having a fall. Early diagnosis and treatment give you the best chance for managing medical conditions that are more common for people who are older than age 71. Falls are a major cause of broken bones and head injuries in people who are older than age 12. Take precautions to prevent a fall at home. Work with your health care provider to learn what changes you can make to improve your health and wellness and to prevent  falls. This information is not intended to replace advice given to you by your health care provider. Make sure you discuss any questions you have with your health care provider. Document Revised: 07/04/2020 Document Reviewed: 07/04/2020 Elsevier Patient Education  [place].

## 2022-02-08 ENCOUNTER — Encounter: Payer: Self-pay | Admitting: Cardiology

## 2022-02-08 ENCOUNTER — Ambulatory Visit: Payer: No Typology Code available for payment source | Attending: Cardiology | Admitting: Cardiology

## 2022-02-08 VITALS — BP 120/70 | HR 57 | Ht 70.0 in | Wt 202.0 lb

## 2022-02-08 DIAGNOSIS — I10 Essential (primary) hypertension: Secondary | ICD-10-CM

## 2022-02-08 DIAGNOSIS — I209 Angina pectoris, unspecified: Secondary | ICD-10-CM | POA: Diagnosis not present

## 2022-02-08 DIAGNOSIS — I25119 Atherosclerotic heart disease of native coronary artery with unspecified angina pectoris: Secondary | ICD-10-CM

## 2022-02-08 DIAGNOSIS — I2510 Atherosclerotic heart disease of native coronary artery without angina pectoris: Secondary | ICD-10-CM

## 2022-02-08 NOTE — Progress Notes (Signed)
Cardiology Office Note:    Date:  02/08/2022   ID:  Casey James, DOB 1956-02-04, MRN [id number]  PCP:  Ladell Pier, MD   Casey James  Cardiologist:  Candee Furbish, MD  Advanced Practice Provider:  No care team member to display Electrophysiologist:  None       Referring MD: Ladell Pier, MD     History of Present Illness:    Casey James is a 66 y.o. male here for the follow-up of coronary artery disease.  Previously seen by Dr. Irish Lack on 02/17/2020 with CTO of RCA on cath.  Chest pain began in 2020.  He had an episode where it was triggered by strenuous activity.  He was started on medications and his angina was much improved.  He did not tolerate the isosorbide initially but this seemed to improve.  Taking it now without much side effects.  He has been remodeling a house without any anginal symptoms.  If he does have chest discomfort, its mild and resolves in a few minutes.  His father died in his 15s of heart disease.  Today, he reports he still experiences chest discomfort upon exertion. He normally sits down and takes deep  breaths which help to resolve the pain.  He normally has to wait for the pain to completely disappear before going back to his activity otherwise the pain lingers. Adds that when the chest pain starts, it radiates to his lungs.   He mentions that he had an episode of syncope when he first started 30 mg Imdur . Since then, he has been doing well on the drug and no syncope episodes.  He notes side effects of watery eyes, vertigo and tinnitus. About 3 episodes of vertigo in the past 6 months.  He denies shortness of breath, palpitations, lightheadedness, headaches, syncope, LE edema, orthopnea, PND.   Past Medical History:  Diagnosis Date   Chest pain    Emphysema of lung ([place])    Former smoker    GERD (gastroesophageal reflux disease)    Hypertension     Past Surgical History:  Procedure Laterality Date    CHOLECYSTECTOMY     FRACTURE SURGERY     LEFT HEART CATH AND CORONARY ANGIOGRAPHY N/A 09/30/2019   Procedure: LEFT HEART CATH AND CORONARY ANGIOGRAPHY;  Surgeon: Burnell Blanks, MD;  Location: [facility];  Service: Cardiovascular;  Laterality: N/A;   pulmonary fibrosis      Current Medications: Current Meds  Medication Sig   aspirin EC 81 MG tablet Take 81 mg by mouth daily.   isosorbide mononitrate (IMDUR) 60 MG 24 hr tablet Take 1 tablet (60 mg total) by mouth daily.   metoprolol succinate (TOPROL-XL) 25 MG 24 hr tablet Take 1 tablet (25 mg total) by mouth daily.   Multiple Vitamins-Minerals (CENTRUM) tablet Take 1 tablet by mouth daily.    nitroGLYCERIN (NITROSTAT) 0.4 MG SL tablet DISSOLVE ONE TABLET UNDER THE TONGUE EVERY 5 MINUTES AS NEEDED FOR CHEST PAIN(IF PAIN PERSIST AFTER 3 DOSES CALL EMS)   rosuvastatin (CRESTOR) 20 MG tablet Take 1 tablet (20 mg total) by mouth daily.     Allergies:   Patient has no known allergies.   Social History   Socioeconomic History   Marital status: Married    Spouse name: Not on file   Number of children: Not on file   Years of education: Not on file   Highest education level: Not on file  Occupational History   Not on file  Tobacco Use   Smoking status: Former    Years: 50.00    Types: Cigarettes    Quit date: 10/21/2018    Years since quitting: 3.3   Smokeless tobacco: Never  Vaping Use   Vaping Use: Never used  Substance and Sexual Activity   Alcohol use: Not Currently   Drug use: Never   Sexual activity: Not on file  Other Topics Concern   Not on file  Social History Narrative   Not on file   Social Determinants of Health   Financial Resource Strain: Low Risk  (02/05/2022)   Overall Financial Resource Strain (CARDIA)    Difficulty of Paying Living Expenses: Not hard at all  Food Insecurity: No Food Insecurity (02/05/2022)   Hunger Vital Sign    Worried About Running Out of Food in the Last Year: Never true     Ran Out of Food in the Last Year: Never true  Transportation Needs: No Transportation Needs (02/05/2022)   PRAPARE - Hydrologist (Medical): No    Lack of Transportation (Non-Medical): No  Physical Activity: Insufficiently Active (02/05/2022)   Exercise Vital Sign    Days of Exercise per Week: 3 days    Minutes of Exercise per Session: 30 min  Stress: No Stress Concern Present (02/05/2022)   [place]    Feeling of Stress : Not at all  Social Connections: Moderately Isolated (02/05/2022)   Social Connection and Isolation Panel [NHANES]    Frequency of Communication with Friends and Family: More than three times a week    Frequency of Social Gatherings with Friends and Family: Once a week    Attends Religious Services: Never    Marine scientist or Organizations: No    Attends Music therapist: Never    Marital Status: Married     Family History: The patient's family history includes Cancer in his mother; Diabetes in his brother; Heart disease in his father; Pulmonary fibrosis in his mother.  ROS:   Please see the history of present illness.   (+) vertigo (+) watery eyes (+) chest pain   All other systems reviewed and are negative.  EKGs/Labs/Other Studies Reviewed:    The following studies were reviewed today: Prior cardiac catheterization reviewed.  CTO RCA.  EKG 02/08/22: SB 57 TWI AL leads, no change 03/08/2021: sinus rhythm 60 pm with t wave inversion, anterior lateral leads  LEFT HEART CATH 09/30/2019:  Prox RCA lesion is 100% stenosed. Ost Cx to Prox Cx lesion is 50% stenosed. 1st Mrg lesion is 40% stenosed. Prox LAD to Mid LAD lesion is 30% stenosed. Dist LAD lesion is 30% stenosed. 1st Diag lesion is 40% stenosed. The left ventricular systolic function is normal. LV end diastolic pressure is normal. The left ventricular ejection fraction is  greater than 65% by visual estimate. There is no mitral valve regurgitation.  1. Chronic total occlusion of the proximal RCA. The mid and distal RCA fills briskly from left to right collaterals. The RCA is a large dominant vessel. Collaterals supplied by the LAD 2. There is mild to moderate non-obstructive plaque in the proximal Circumflex and high first obtuse marginal branch. This is a moderate caliber non-dominant vessel.  3. The LAD has mild plaque in the proximal and mid vessel.  4. Normal LV systolic function    Recent Labs: 03/14/2021: ALT 21; Hemoglobin 15.6; Platelets  219 07/27/2021: BUN 11; Creatinine, Ser 1.03; Potassium 4.7; Sodium 143  Recent Lipid Panel    Component Value Date/Time   CHOL 126 07/27/2021 1455   TRIG 187 (H) 07/27/2021 1455   HDL 31 (L) 07/27/2021 1455   CHOLHDL 4.1 07/27/2021 1455   CHOLHDL 5.8 07/16/2012 1359   VLDL 36 07/16/2012 1359   LDLCALC 64 07/27/2021 1455     Risk Assessment/Calculations:      Physical Exam:    VS:  BP 120/70 (BP Location: Left Arm, Patient Position: Sitting, Cuff Size: Normal)   Pulse (!) 57   Ht '5\' 10"'$  (1.778 m)   Wt 202 lb (91.6 kg)   BMI 28.98 kg/m     Wt Readings from Last 3 Encounters:  02/08/22 202 lb (91.6 kg)  01/26/22 200 lb (90.7 kg)  07/27/21 214 lb 12.8 oz (97.4 kg)     GEN:  Well nourished, well developed in no acute distress HEENT: Normal NECK: No JVD; No carotid bruits LYMPHATICS: No lymphadenopathy CARDIAC: RRR, no murmurs, rubs, gallops RESPIRATORY:  Clear to auscultation without rales, wheezing or rhonchi  ABDOMEN: Soft, non-tender, non-distended MUSCULOSKELETAL:  No edema; No deformity  SKIN: Warm and dry NEUROLOGIC:  Alert and oriented x 3 PSYCHIATRIC:  Normal affect   ASSESSMENT:    1. Coronary artery disease involving native coronary artery of native heart without angina pectoris   2. Essential hypertension   3. Angina pectoris (HCC)      PLAN:    In order of problems listed  above:   Coronary artery disease involving native coronary artery of native heart without angina pectoris CTO RCA proximally occluded.  Collaterals noted.  Moderate disease elsewhere on heart catheterization 09/2019.  Improved angina, less severe, less frequent. Had an episode when wife had to go to airport at [place] weather.  Currently taking isosorbide 60 mg as well as Toprol 25.  Heart rate 57.  Feels much better.  Could consider increasing isosorbide later or adding Ranexa. I once again personally reviewed his cardiac catheterization films.  If necessary, I will consult with Dr. Angelena Form who performed heart catheterization.  Essential hypertension At prior office visit with Dr. Wynetta Emery she noticed blood pressure was in the 140/70 range.  Currently it is 120/70.  At home it was 747 systolic.  Lets continue to monitor at home.  If necessary could always add a low-dose irbesartan or olmesartan.  Disequilibrium Vertigo-like symptoms occasionally.  Turns head in bed feels movement.  Be careful on ladders.  No recent issues.  Pure hypercholesterolemia Continue with Crestor 20 mg a day.  No myalgias.  LDL 64 at prior check.  No changes.  Former smoker -Quit in 2020.  Excellent.  Says he drank and smoked for about 50 years.  Family history of coronary artery disease -Father died in his 36s from heart disease.  His brother in his 17s has pulmonary fibrosis and 6 months to live he states.  His mother died of the same thing.    Medication Adjustments/Labs and Tests Ordered: Current medicines are reviewed at length with the patient today.  Concerns regarding medicines are outlined above.  Orders Placed This Encounter  Procedures   EKG 12-Lead   No orders of the defined types were placed in this encounter.   Patient Instructions  Medication Instructions:  The current medical regimen is effective;  continue present plan and medications.  *If you need a refill on your cardiac medications  before your next appointment, please call  your pharmacy*  Follow-Up: At [facility], you and your health needs are our priority.  As part of our continuing mission to provide you with exceptional heart care, we have created designated Provider Care Teams.  These Care Teams include your primary Cardiologist (physician) and Advanced Practice Providers (APPs -  Physician Assistants and Nurse Practitioners) who all work together to provide you with the care you need, when you need it.  We recommend signing up for the patient portal called "MyChart".  Sign up information is provided on this After Visit Summary.  MyChart is used to connect with patients for Virtual Visits (Telemedicine).  Patients are able to view lab/test results, encounter notes, upcoming appointments, etc.  Non-urgent messages can be sent to your provider as well.   To learn more about what you can do with MyChart, go to [email or web address].    Your next appointment:   6 month(s)  The format for your next appointment:   In Person  Provider:   Candee Furbish, MD      Important Information About Sugar            Signed, Candee Furbish, MD  02/08/2022 4:50 PM    [place]

## 2022-02-08 NOTE — Patient Instructions (Signed)
Medication Instructions:  The current medical regimen is effective;  continue present plan and medications.  *If you need a refill on your cardiac medications before your next appointment, please call your pharmacy*  Follow-Up: At [facility], you and your health needs are our priority.  As part of our continuing mission to provide you with exceptional heart care, we have created designated Provider Care Teams.  These Care Teams include your primary Cardiologist (physician) and Advanced Practice Providers (APPs -  Physician Assistants and Nurse Practitioners) who all work together to provide you with the care you need, when you need it.  We recommend signing up for the patient portal called "MyChart".  Sign up information is provided on this After Visit Summary.  MyChart is used to connect with patients for Virtual Visits (Telemedicine).  Patients are able to view lab/test results, encounter notes, upcoming appointments, etc.  Non-urgent messages can be sent to your provider as well.   To learn more about what you can do with MyChart, go to [email or web address].    Your next appointment:   6 month(s)  The format for your next appointment:   In Person  Provider:   Mark Skains, MD      Important Information About Sugar       

## 2022-05-28 ENCOUNTER — Ambulatory Visit: Payer: 59 | Attending: Internal Medicine | Admitting: Internal Medicine

## 2022-05-28 ENCOUNTER — Encounter: Payer: Self-pay | Admitting: Internal Medicine

## 2022-05-28 VITALS — BP 136/71 | HR 51 | Ht 69.0 in | Wt 205.0 lb

## 2022-05-28 DIAGNOSIS — E119 Type 2 diabetes mellitus without complications: Secondary | ICD-10-CM

## 2022-05-28 DIAGNOSIS — M7712 Lateral epicondylitis, left elbow: Secondary | ICD-10-CM

## 2022-05-28 DIAGNOSIS — E1159 Type 2 diabetes mellitus with other circulatory complications: Secondary | ICD-10-CM

## 2022-05-28 DIAGNOSIS — I152 Hypertension secondary to endocrine disorders: Secondary | ICD-10-CM | POA: Diagnosis not present

## 2022-05-28 DIAGNOSIS — I25118 Atherosclerotic heart disease of native coronary artery with other forms of angina pectoris: Secondary | ICD-10-CM

## 2022-05-28 DIAGNOSIS — Z125 Encounter for screening for malignant neoplasm of prostate: Secondary | ICD-10-CM

## 2022-05-28 DIAGNOSIS — Z2821 Immunization not carried out because of patient refusal: Secondary | ICD-10-CM

## 2022-05-28 NOTE — Progress Notes (Signed)
Patient ID: Casey James, male    DOB: 11-01-1955  MRN: [id number]  CC: chronic ds management  Subjective: Casey James is a 67 y.o. male who presents for chronic ds management His concerns today include:  GERD, former smoker, HL, HTN, obesity, marijuana use, CAD, DM type 2.      DM: Lab Results  Component Value Date   HGBA1C 6.1 01/26/2022  Still doing well with eating habits. Not getting in much exercise.  Developed URI early Feb; had a lot of congestion cough and fever.  Home COVID test was negative.  Took a while to resolve; plans to start exercising a gain  HTN/CAD/HL: Currently taking: see medication list.  He is on isosorbide 60 mg daily, metoprolol XL 25 mg daily, Crestor 20 mg daily, aspirin 81 mg daily.  Med Adherence: []  Yes    [x]  No Medication side effects: []  Yes    [x]  No Adherence with salt restriction: [x]  Yes    []  No BP Home Monitoring?: [x]  Yes -a few times a day Monitoring Frequency: 120-130/60-70s Home BP results range:  SOB? []  Yes    [x]  No Chest Pain?: [x]  Yes if he over does it and in cold weather.  Last used SL Nitro in the fall Leg swelling?: []  Yes    [x]  No Headaches?: []  Yes    [x]  No Dizziness? []  Yes    [x]  No Comments:   C/O aching in LT elbow x 2 mths; thinks it is tennis elbow.  Started after he raked his yard for several days.  No swelling.  Taking two 325 mg ASA a day since last wk and cold packs.   Getting little better.  Hurts when he tries to pick up anything heavy in the hand  HM:  Declines PCV.  Due for PSA level check for prostate cancer screening. Patient Active Problem List   Diagnosis Date Noted   Benign paroxysmal positional vertigo of left ear 03/14/2021   Disequilibrium 03/08/2021   Pure hypercholesterolemia 03/08/2021   Influenza vaccine refused 01/12/2020   Coronary artery disease involving native coronary artery of native heart without angina pectoris    Essential hypertension 09/08/2019   Obesity (BMI 30-39.9)  07/28/2019   Marijuana user 07/28/2019   Chest pain in adult 06/23/2019   Gastroesophageal reflux disease without esophagitis 06/23/2019   Former smoker 06/23/2019   Ptosis of eyelid, right 07/13/2011     Current Outpatient Medications on File Prior to Visit  Medication Sig Dispense Refill   aspirin EC 81 MG tablet Take 81 mg by mouth daily.     isosorbide mononitrate (IMDUR) 60 MG 24 hr tablet Take 1 tablet (60 mg total) by mouth daily. 90 tablet 3   metoprolol succinate (TOPROL-XL) 25 MG 24 hr tablet Take 1 tablet (25 mg total) by mouth daily. 90 tablet 2   Multiple Vitamins-Minerals (CENTRUM) tablet Take 1 tablet by mouth daily.      nitroGLYCERIN (NITROSTAT) 0.4 MG SL tablet DISSOLVE ONE TABLET UNDER THE TONGUE EVERY 5 MINUTES AS NEEDED FOR CHEST PAIN(IF PAIN PERSIST AFTER 3 DOSES CALL EMS) 25 tablet 1   rosuvastatin (CRESTOR) 20 MG tablet Take 1 tablet (20 mg total) by mouth daily. 90 tablet 2   No current facility-administered medications on file prior to visit.    No Known Allergies  Social History   Socioeconomic History   Marital status: Married    Spouse name: Not on file   Number of children: Not on  file   Years of education: Not on file   Highest education level: Not on file  Occupational History   Not on file  Tobacco Use   Smoking status: Former    Years: 84    Types: Cigarettes    Quit date: 10/21/2018    Years since quitting: 3.6   Smokeless tobacco: Never  Vaping Use   Vaping Use: Never used  Substance and Sexual Activity   Alcohol use: Not Currently   Drug use: Never   Sexual activity: Not on file  Other Topics Concern   Not on file  Social History Narrative   Not on file   Social Determinants of Health   Financial Resource Strain: Low Risk  (02/05/2022)   Overall Financial Resource Strain (CARDIA)    Difficulty of Paying Living Expenses: Not hard at all  Food Insecurity: No Food Insecurity (02/05/2022)   Hunger Vital Sign    Worried About  Running Out of Food in the Last Year: Never true    [place] in the Last Year: Never true  Transportation Needs: No Transportation Needs (02/05/2022)   PRAPARE - Hydrologist (Medical): No    Lack of Transportation (Non-Medical): No  Physical Activity: Insufficiently Active (02/05/2022)   Exercise Vital Sign    Days of Exercise per Week: 3 days    Minutes of Exercise per Session: 30 min  Stress: No Stress Concern Present (02/05/2022)   [place]    Feeling of Stress : Not at all  Social Connections: Moderately Isolated (02/05/2022)   Social Connection and Isolation Panel [NHANES]    Frequency of Communication with Friends and Family: More than three times a week    Frequency of Social Gatherings with Friends and Family: Once a week    Attends Religious Services: Never    Marine scientist or Organizations: No    Attends Archivist Meetings: Never    Marital Status: Married  Human resources officer Violence: Not At Risk (02/05/2022)   Humiliation, Afraid, Rape, and Kick questionnaire    Fear of Current or Ex-Partner: No    Emotionally Abused: No    Physically Abused: No    Sexually Abused: No    Family History  Problem Relation Age of Onset   Cancer Mother    Pulmonary fibrosis Mother    Diabetes Brother    Heart disease Father     Past Surgical History:  Procedure Laterality Date   CHOLECYSTECTOMY     FRACTURE SURGERY     LEFT HEART CATH AND CORONARY ANGIOGRAPHY N/A 09/30/2019   Procedure: LEFT HEART CATH AND CORONARY ANGIOGRAPHY;  Surgeon: Burnell Blanks, MD;  Location: [facility];  Service: Cardiovascular;  Laterality: N/A;   pulmonary fibrosis      ROS: Review of Systems Negative except as stated above  PHYSICAL EXAM: BP 136/71 (BP Location: Left Arm, Patient Position: Sitting, Cuff Size: Normal)   Pulse (!) 51   Ht 5\' 9"  (1.753 m)   Wt  205 lb (93 kg)   SpO2 94%   BMI 30.27 kg/m   Physical Exam BP 140/70  General appearance - alert, well appearing, and in no distress Mental status - normal mood, behavior, speech, dress, motor activity, and thought processes Neck - supple, no significant adenopathy Chest - clear to auscultation, no wheezes, rales or rhonchi, symmetric air entry Heart - normal rate, regular rhythm,  normal S1, S2, no murmurs, rubs, clicks or gallops Musculoskeletal - LT elbow: No edema or erythema.  Mild tenderness over the lateral epicondyles on palpation and with pronation and supination of the forearm. Extremities - peripheral pulses normal, no pedal edema, no clubbing or cyanosis     Latest Ref Rng & Units 07/27/2021    2:55 PM 03/14/2021    4:15 PM 09/28/2019    2:12 PM  CMP  Glucose 70 - 99 mg/dL 103  104  99   BUN 8 - 27 mg/dL 11  10  12    Creatinine 0.76 - 1.27 mg/dL 1.03  1.22  0.92   Sodium 134 - 144 mmol/L 143  141  139   Potassium 3.5 - 5.2 mmol/L 4.7  4.7  4.8   Chloride 96 - 106 mmol/L 103  102  104   CO2 20 - 29 mmol/L 23  25  24    Calcium 8.6 - 10.2 mg/dL 9.4  9.1  9.4   Total Protein 6.0 - 8.5 g/dL  6.9    Total Bilirubin 0.0 - 1.2 mg/dL  0.8    Alkaline Phos 44 - 121 IU/L  83    AST 0 - 40 IU/L  20    ALT 0 - 44 IU/L  21     Lipid Panel     Component Value Date/Time   CHOL 126 07/27/2021 1455   TRIG 187 (H) 07/27/2021 1455   HDL 31 (L) 07/27/2021 1455   CHOLHDL 4.1 07/27/2021 1455   CHOLHDL 5.8 07/16/2012 1359   VLDL 36 07/16/2012 1359   LDLCALC 64 07/27/2021 1455    CBC    Component Value Date/Time   WBC 10.0 03/14/2021 1615   WBC 11.1 (H) 07/16/2012 1359   RBC 5.13 03/14/2021 1615   RBC 5.12 07/16/2012 1359   HGB 15.6 03/14/2021 1615   HCT 45.8 03/14/2021 1615   PLT 219 03/14/2021 1615   MCV 89 03/14/2021 1615   MCH 30.4 03/14/2021 1615   MCH 30.7 07/16/2012 1359   MCHC 34.1 03/14/2021 1615   MCHC 34.7 07/16/2012 1359   RDW 13.6 03/14/2021 1615   LYMPHSABS  2.4 07/16/2012 1359   MONOABS 0.6 07/16/2012 1359   EOSABS 0.1 07/16/2012 1359   BASOSABS 0.0 07/16/2012 1359    ASSESSMENT AND PLAN:  1. Type 2 diabetes, diet controlled This has been diet controlled.  Encouraged him to continue healthy eating habits.  He plans to start walking again for exercise. - Hemoglobin A1c  2. Hypertension associated with diabetes Reported home blood pressure readings have been good.  No changes made to medicines listed above.  3. Coronary artery disease of native artery of native heart with stable angina pectoris Stable.  Continue aspirin, Crestor, metoprolol and isosorbide. - Hepatic Function Panel  4. Epicondylitis, lateral, left Recommend using Voltaren gel over-the-counter and cut back on aspirin to 81 mg baby aspirin taken for heart disease. Use warm compresses. Recommend referral to orthopedics but patient declined at this time.  5. Prostate cancer screening Patient agreeable to PSA level check. - PSA  6.  Patient declined pneumonia vaccine.  Patient was given the opportunity to ask questions.  Patient verbalized understanding of the plan and was able to repeat key elements of the plan.   This documentation was completed using Radio producer.  Any transcriptional errors are unintentional.  No orders of the defined types were placed in this encounter.    Requested Prescriptions  No prescriptions requested or ordered in this encounter    No follow-ups on file.  Karle Plumber, MD, FACP

## 2022-05-29 LAB — HEPATIC FUNCTION PANEL
ALT: 20 IU/L (ref 0–44)
AST: 21 IU/L (ref 0–40)
Albumin: 4.5 g/dL (ref 3.9–4.9)
Alkaline Phosphatase: 78 IU/L (ref 44–121)
Bilirubin Total: 1.2 mg/dL (ref 0.0–1.2)
Bilirubin, Direct: 0.26 mg/dL (ref 0.00–0.40)
Total Protein: 7.2 g/dL (ref 6.0–8.5)

## 2022-05-29 LAB — PSA: Prostate Specific Ag, Serum: 1 ng/mL (ref 0.0–4.0)

## 2022-05-29 LAB — HEMOGLOBIN A1C
Est. average glucose Bld gHb Est-mCnc: 137 mg/dL
Hgb A1c MFr Bld: 6.4 % — ABNORMAL HIGH (ref 4.8–5.6)

## 2022-06-06 ENCOUNTER — Encounter: Payer: Self-pay | Admitting: Internal Medicine

## 2022-06-06 ENCOUNTER — Other Ambulatory Visit: Payer: Self-pay | Admitting: Internal Medicine

## 2022-06-06 DIAGNOSIS — M7712 Lateral epicondylitis, left elbow: Secondary | ICD-10-CM

## 2022-06-08 ENCOUNTER — Ambulatory Visit (INDEPENDENT_AMBULATORY_CARE_PROVIDER_SITE_OTHER): Payer: 59 | Admitting: Sports Medicine

## 2022-06-08 ENCOUNTER — Encounter: Payer: Self-pay | Admitting: Sports Medicine

## 2022-06-08 DIAGNOSIS — M7712 Lateral epicondylitis, left elbow: Secondary | ICD-10-CM | POA: Diagnosis not present

## 2022-06-08 MED ORDER — METHYLPREDNISOLONE 4 MG PO TBPK: ORAL_TABLET | ORAL | 0 refills | Status: DC

## 2022-06-08 NOTE — Progress Notes (Signed)
Casey James - 67 y.o. male MRN [id number]  Date of birth: [birth year]  Office Visit Note: Visit Date: 06/08/2022 PCP: [name], MD Referred by: [name], MD  Subjective: Chief Complaint  Patient presents with   Left Elbow - Pain   HPI: Casey James is a pleasant 67 y.o. male who presents today for left lateral elbow pain.  He has had left lateral elbow pain since January of this year.  He denies any specific injury but noticed that after he was doing a few days of using the chainsaw and doing yard work he started having pain and soreness over the lateral elbow.  He gets worsening of his pain with gripping or extending the wrist.  His pain does bother him at night.  He is right-hand dominant.  He has been using a heating pad and aspirin without much relief.  Denies any redness or swelling.  He is a type-II diabetic, currently diet-controlled. Lab Results  Component Value Date   HGBA1C 6.4 (H) 05/28/2022   Pertinent ROS were reviewed with the patient and found to be negative unless otherwise specified above in HPI.   Assessment & Plan: Visit Diagnoses:  1. Lateral epicondylitis, left elbow   2. Type 2 diabetes mellitus with diabetic polyneuropathy, without long-term current use of insulin    Plan: Discussed with Casey James today that he does have signs and symptoms indicative of lateral epicondylitis.  We discussed all treatment options such as oral medication therapy, physical therapy versus home rehab, injection therapy, bracing.  We we will proceed with a 6-day course of methylprednisolone to help with inflammation and pain.  We did fit him for a counterforce strap today which she found comfortable.  I did print out a customized handout for lateral epicondyle and wrist extensor tendon rehab and reviewed these with him in the room today.  He will perform these once daily.  I would like him to ice the area at least once to twice daily for 15 minutes.  I would like to  see him back in about 1 month for reevaluation.  Will avoid NSAIDs given his history of CAD.  Follow-up: Return in about 26 days (around 07/04/2022) for f/u in 3-4 weeks for left LE.   Meds & Orders: No orders of the defined types were placed in this encounter.  No orders of the defined types were placed in this encounter.    Procedures: No procedures performed      Clinical History: No specialty comments available.  He reports that he quit smoking about 3 years ago. His smoking use included cigarettes. He has never used smokeless tobacco.  Recent Labs    07/27/21 1455 01/26/22 1543 05/28/22 1510  HGBA1C 6.7* 6.1 6.4*    Objective:    Physical Exam  Gen: Well-appearing, in no acute distress; non-toxic CV:  Well-perfused. Warm.  Resp: Breathing unlabored on room air; no wheezing. Psych: Fluid speech in conversation; appropriate affect; normal thought process Neuro: Sensation intact throughout. No gross coordination deficits.   Ortho Exam - Left elbow: Positive TTP over the lateral epicondyle and the common extensor origin insertion.  There is no redness or swelling nor elbow effusion.  He has full range of motion with flexion and extension.  There is pain with resisted extension and resisted supination.  Positive Cozen's and positive Mulder's test. NVI.  Imaging: No results found.  Past Medical/Family/Surgical/Social History: Medications & Allergies reviewed per EMR, new medications updated. Patient Active Problem  List   Diagnosis Date Noted   Benign paroxysmal positional vertigo of left ear 03/14/2021   Disequilibrium 03/08/2021   Pure hypercholesterolemia 03/08/2021   Influenza vaccine refused 01/12/2020   Coronary artery disease involving native coronary artery of native heart without angina pectoris    Essential hypertension 09/08/2019   Obesity (BMI 30-39.9) 07/28/2019   Marijuana user 07/28/2019   Chest pain in adult 06/23/2019   Gastroesophageal reflux disease  without esophagitis 06/23/2019   Former smoker 06/23/2019   Ptosis of eyelid, right 07/13/2011   Past Medical History:  Diagnosis Date   Chest pain    Emphysema of lung    Former smoker    GERD (gastroesophageal reflux disease)    Hypertension    Family History  Problem Relation Age of Onset   Cancer Mother    Pulmonary fibrosis Mother    Diabetes Brother    Heart disease Father    Past Surgical History:  Procedure Laterality Date   CHOLECYSTECTOMY     FRACTURE SURGERY     LEFT HEART CATH AND CORONARY ANGIOGRAPHY N/A 09/30/2019   Procedure: LEFT HEART CATH AND CORONARY ANGIOGRAPHY;  Surgeon: Kathleene Hazel, MD;  Location: [facility];  Service: Cardiovascular;  Laterality: N/A;   pulmonary fibrosis     Social History   Occupational History   Not on file  Tobacco Use   Smoking status: Former    Years: 50    Types: Cigarettes    Quit date: 10/21/2018    Years since quitting: 3.6   Smokeless tobacco: Never  Vaping Use   Vaping Use: Never used  Substance and Sexual Activity   Alcohol use: Not Currently   Drug use: Never   Sexual activity: Not on file   Patient was instructed in 10 minutes of therapeutic exercises for left elbow (lateral epicondylitis) to improve strength, ROM and function according to my instructions and plan of care by myself during the office visit. A customized handout was provided and demonstration of proper technique shown and discussed. Patient did perform exercises and demonstrate understanding through teachback.  All questions discussed and answered.  [name], DO Primary Care Sports Medicine Physician  [facility]  This note was dictated using Dragon naturally speaking software and may contain errors in syntax, spelling, or content which have not been identified prior to signing this note.

## 2022-07-05 ENCOUNTER — Encounter: Payer: Self-pay | Admitting: Sports Medicine

## 2022-07-05 ENCOUNTER — Other Ambulatory Visit: Payer: Self-pay

## 2022-07-05 ENCOUNTER — Ambulatory Visit (INDEPENDENT_AMBULATORY_CARE_PROVIDER_SITE_OTHER): Payer: 59 | Admitting: Sports Medicine

## 2022-07-05 ENCOUNTER — Telehealth: Payer: Self-pay | Admitting: Cardiology

## 2022-07-05 DIAGNOSIS — I2510 Atherosclerotic heart disease of native coronary artery without angina pectoris: Secondary | ICD-10-CM

## 2022-07-05 DIAGNOSIS — M25522 Pain in left elbow: Secondary | ICD-10-CM

## 2022-07-05 DIAGNOSIS — M7712 Lateral epicondylitis, left elbow: Secondary | ICD-10-CM | POA: Diagnosis not present

## 2022-07-05 DIAGNOSIS — G8929 Other chronic pain: Secondary | ICD-10-CM

## 2022-07-05 MED ORDER — BETAMETHASONE SOD PHOS & ACET 6 (3-3) MG/ML IJ SUSP
6.0000 mg | INTRAMUSCULAR | Status: AC | PRN
Start: 2022-07-05 — End: 2022-07-05
  Administered 2022-07-05: 6 mg via INTRA_ARTICULAR

## 2022-07-05 MED ORDER — LIDOCAINE HCL 1 % IJ SOLN
1.0000 mL | INTRAMUSCULAR | Status: AC | PRN
Start: 2022-07-05 — End: 2022-07-05
  Administered 2022-07-05: 1 mL

## 2022-07-05 MED ORDER — BUPIVACAINE HCL 0.25 % IJ SOLN
1.0000 mL | INTRAMUSCULAR | Status: AC | PRN
Start: 2022-07-05 — End: 2022-07-05
  Administered 2022-07-05: 1 mL

## 2022-07-05 NOTE — Progress Notes (Signed)
Casey James - 67 y.o. male MRN [id number]  Date of birth: 05-Sep-1955  Office Visit Note: Visit Date: 07/05/2022 PCP: [name], MD Referred by: [name], MD  Subjective: Chief Complaint  Patient presents with   Left Elbow - Follow-up   HPI: Casey James is a pleasant 67 y.o. male who presents today for follow-up of his chronic left elbow pain, lateral epicondylitis.  The left elbow still bothering him but he is unable to view yard work and other outside activity.  He did have the pain subsided the same out when he was taking the prednisone orally, however a week or so after this finishes the pain started to return.  No redness or swelling.  Pain with supination and extension of the wrist/forearm.  He has been taking a few aspirin as needed.  He does have coronary artery disease, managed on metoprolol 25 mg daily, aspirin 81 mg daily.  Also uses Imdur.  Needs to limit NSAIDs because of this.  Pertinent ROS were reviewed with the patient and found to be negative unless otherwise specified above in HPI.   Assessment & Plan: Visit Diagnoses:  1. Lateral epicondylitis, left elbow   2. Chronic elbow pain, left   3. Coronary artery disease involving native coronary artery of native heart without angina pectoris    Plan: Discussed with Casey James that his exam and ultrasound findings do reveal evidence of active lateral epicondylitis, limited ultrasound did likely show common extensor tendon origin tendinopathy as well.  He got fairly decent relief with the oral prednisone, however his pain has returned.  We discussed other treatment options such as injection therapy, advanced imaging, formalized physical therapy.  Through shared decision-making, elected to proceed with ultrasound-guided lateral epicondyle injection.  We will get him started in formalized physical therapy, referral sent today.  He will continue on his counterforce strap with activity.  Given his CAD, will  need to limit NSAIDs, although would like him to discuss with his cardiologist if he can take his lower dose of naproxen a few times a week only for breakthrough pain and inflammation.  He will follow-up about 6 weeks after starting physical therapy for reevaluation.  If for some reason he is not having at least moderate improvement, we could always consider obtaining an MRI of the elbow to evaluate for the degree of tearing. Other treatment options could be PRP injection therapy or prolotherapy; ESWT.  Follow-up: Return in about 7 weeks (around 08/23/2022) for follow-up 6 weeks after starting PT.   Meds & Orders: No orders of the defined types were placed in this encounter.   Orders Placed This Encounter  Procedures   [place] Extrem Up Left Ltd     Procedures: Hand/UE Inj: L elbow for lateral epicondylitis on 07/05/2022 2:08 PM Indications: pain Details: 25 G needle, dorsal approach Medications: 1 mL lidocaine 1 %; 1 mL bupivacaine 0.25 %; 6 mg betamethasone acetate-betamethasone sodium phosphate 6 (3-3) MG/ML Outcome: tolerated well, no immediate complications  Procedure: Lateral Epicondylitis (Common Extensor Tendon) Injection, Left Elbow After informed verbal consent was obtained, a timeout was performed.  Patient was lying supine on exam table.  Area overlying the affected lateral epicondyle was prepped with chloraprep and alcohol swabs. Then utilizing ultrasound guidance via an in-plane approach, the patient's lateral epicondyle and common extensor tendon was injected with 1:1:1 lidocaine:bupivicaine:betamethasone with multiple needle fenestrations from a distal to proximal direction. Patient tolerated procedure well without immediate complications.  Procedure, treatment alternatives,  risks and benefits explained, specific risks discussed. Consent was given by the patient. Immediately prior to procedure a time out was called to verify the correct patient, procedure, equipment, support staff and  site/side marked as required. Patient was prepped and draped in the usual sterile fashion.          Clinical History: No specialty comments available.  He reports that he quit smoking about 3 years ago. His smoking use included cigarettes. He has never used smokeless tobacco.  Recent Labs    07/27/21 1455 01/26/22 1543 05/28/22 1510  HGBA1C 6.7* 6.1 6.4*    Objective:    Physical Exam  Gen: Well-appearing, in no acute distress; non-toxic OZ:[id number]. Warm.  Resp: Breathing unlabored on room air; no wheezing. Psych: Fluid speech in conversation; appropriate affect; normal thought process Neuro: Sensation intact throughout. No gross coordination deficits.   Ortho Exam - Left elbow: Positive TTP over the lateral epicondyles and 1-2 cm distal to this.  Pain with resisted wrist extension as well as resisted wrist supination.  Positive Mulder's test.  There is full range of motion with flexion and extension.  Imaging: [place] Extrem Up Left Ltd  Result Date: 07/05/2022 Limited musculoskeletal ultrasound was performed today, left elbow.  Lateral epicondyles and the radiocapitellar joint was evaluated without joint effusion.  There is a spur off the very proximal end of the lateral epicondyle.  Common extensor tendon origin was evaluated with some mild hypoechoic change near the proximal insertion although no evidence of full grade thickness tearing.  There is notable hyperemia in this location. Impression: Acute lateral epicondylitis with likely common extensor tendon origin tendinopathy     Past Medical/Family/Surgical/Social History: Medications & Allergies reviewed per EMR, new medications updated. Patient Active Problem List   Diagnosis Date Noted   Benign paroxysmal positional vertigo of left ear 03/14/2021   Disequilibrium 03/08/2021   Pure hypercholesterolemia 03/08/2021   Influenza vaccine refused 01/12/2020   Coronary artery disease involving native coronary artery of  native heart without angina pectoris    Essential hypertension 09/08/2019   Obesity (BMI 30-39.9) 07/28/2019   Marijuana user 07/28/2019   Chest pain in adult 06/23/2019   Gastroesophageal reflux disease without esophagitis 06/23/2019   Former smoker 06/23/2019   Ptosis of eyelid, right 07/13/2011   Past Medical History:  Diagnosis Date   Chest pain    Emphysema of lung (HCC)    Former smoker    GERD (gastroesophageal reflux disease)    Hypertension    Family History  Problem Relation Age of Onset   Cancer Mother    Pulmonary fibrosis Mother    Diabetes Brother    Heart disease Father    Past Surgical History:  Procedure Laterality Date   CHOLECYSTECTOMY     FRACTURE SURGERY     LEFT HEART CATH AND CORONARY ANGIOGRAPHY N/A 09/30/2019   Procedure: LEFT HEART CATH AND CORONARY ANGIOGRAPHY;  Surgeon: Kathleene Hazel, MD;  Location: [facility];  Service: Cardiovascular;  Laterality: N/A;   pulmonary fibrosis     Social History   Occupational History   Not on file  Tobacco Use   Smoking status: Former    Years: 50    Types: Cigarettes    Quit date: 10/21/2018    Years since quitting: 3.7   Smokeless tobacco: Never  Vaping Use   Vaping Use: Never used  Substance and Sexual Activity   Alcohol use: Not Currently   Drug use: Never   Sexual activity:  Not on file

## 2022-07-05 NOTE — Telephone Encounter (Signed)
Will send to Dr Anne Fu to review for appropriateness of short term use of NSAIDs for his shoulder pain.

## 2022-07-05 NOTE — Progress Notes (Signed)
Not much better; states the pain had subsided some during the prednisone But now is back to where it was Doing HEP, but certain ones cause pain to his wrist  He is wearing the brace as instructed

## 2022-07-05 NOTE — Telephone Encounter (Signed)
Pt c/o medication issue:  1. Name of Medication:   Aleve  2. How are you currently taking this medication (dosage and times per day)?   Not taking yet  3. Are you having a reaction (difficulty breathing--STAT)?   4. What is your medication issue?   Patient stated he has been having pain due to a rotator cuff injury and his doctor prescribed him Aleve.  Patient wants to know if he should take take the Aleve.

## 2022-07-06 NOTE — Telephone Encounter (Signed)
Pt is aware OK to take short term.  He has had a problem with "tennis elbow."

## 2022-07-06 NOTE — Telephone Encounter (Signed)
OK to use short term Donato Schultz, MD

## 2022-07-30 ENCOUNTER — Encounter: Payer: Self-pay | Admitting: Cardiology

## 2022-07-30 ENCOUNTER — Ambulatory Visit: Payer: 59 | Attending: Cardiology | Admitting: Cardiology

## 2022-07-30 VITALS — BP 116/58 | HR 60 | Ht 69.0 in | Wt 210.2 lb

## 2022-07-30 DIAGNOSIS — I10 Essential (primary) hypertension: Secondary | ICD-10-CM

## 2022-07-30 DIAGNOSIS — I209 Angina pectoris, unspecified: Secondary | ICD-10-CM

## 2022-07-30 DIAGNOSIS — I25119 Atherosclerotic heart disease of native coronary artery with unspecified angina pectoris: Secondary | ICD-10-CM | POA: Diagnosis not present

## 2022-07-30 DIAGNOSIS — I2510 Atherosclerotic heart disease of native coronary artery without angina pectoris: Secondary | ICD-10-CM

## 2022-07-30 NOTE — Progress Notes (Signed)
Cardiology Office Note:    Date:  07/30/2022   ID:  Sharyl Nimrod, DOB [birth year], MRN [id number]  PCP:  [name], MD   [facility]  Cardiologist:  Donato Schultz, MD  Advanced Practice Provider:  No care team member to display Electrophysiologist:  None       Referring MD: [name], MD     History of Present Illness:    Casey James is a 67 y.o. male here for the follow-up of coronary artery disease.  Previously seen by Dr. Eldridge Dace on 02/17/2020 with CTO of RCA on cath.  Chest pain began in 2020.  He had an episode where it was triggered by strenuous activity.  He was started on medications and his angina was much improved.  He did not tolerate the isosorbide initially but this seemed to improve.  Taking it now without much side effects.  He has been remodeling a house without any anginal symptoms.  If he does have chest discomfort, its mild and resolves in a few minutes.  His father died in his 18s of heart disease. Doing very well.  Main complaint is left-sided tennis elbow.  Dr. Shon Baton with orthopedics has been helping, injection.  Still has some occasional chest pain but since starting the isosorbide this seems to have helped.  He is now on 60 mg.   Past Medical History:  Diagnosis Date   Chest pain    Emphysema of lung (HCC)    Former smoker    GERD (gastroesophageal reflux disease)    Hypertension     Past Surgical History:  Procedure Laterality Date   CHOLECYSTECTOMY     FRACTURE SURGERY     LEFT HEART CATH AND CORONARY ANGIOGRAPHY N/A 09/30/2019   Procedure: LEFT HEART CATH AND CORONARY ANGIOGRAPHY;  Surgeon: Kathleene Hazel, MD;  Location: [facility];  Service: Cardiovascular;  Laterality: N/A;   pulmonary fibrosis      Current Medications: Current Meds  Medication Sig   aspirin EC 81 MG tablet Take 81 mg by mouth daily.   isosorbide mononitrate (IMDUR) 60 MG 24 hr tablet Take 1 tablet (60 mg total)  by mouth daily.   metoprolol succinate (TOPROL-XL) 25 MG 24 hr tablet Take 1 tablet (25 mg total) by mouth daily.   Multiple Vitamins-Minerals (CENTRUM) tablet Take 1 tablet by mouth daily.    nitroGLYCERIN (NITROSTAT) 0.4 MG SL tablet DISSOLVE ONE TABLET UNDER THE TONGUE EVERY 5 MINUTES AS NEEDED FOR CHEST PAIN(IF PAIN PERSIST AFTER 3 DOSES CALL EMS)   rosuvastatin (CRESTOR) 20 MG tablet Take 1 tablet (20 mg total) by mouth daily.     Allergies:   Patient has no known allergies.   Social History   Socioeconomic History   Marital status: Married    Spouse name: Not on file   Number of children: Not on file   Years of education: Not on file   Highest education level: Not on file  Occupational History   Not on file  Tobacco Use   Smoking status: Former    Years: 50    Types: Cigarettes    Quit date: 10/21/2018    Years since quitting: 3.7   Smokeless tobacco: Never  Vaping Use   Vaping Use: Never used  Substance and Sexual Activity   Alcohol use: Not Currently   Drug use: Never   Sexual activity: Not on file  Other Topics Concern   Not on file  Social History Narrative   Not on file   Social Determinants of Health   Financial Resource Strain: Low Risk  (02/05/2022)   Overall Financial Resource Strain (CARDIA)    Difficulty of Paying Living Expenses: Not hard at all  Food Insecurity: No Food Insecurity (02/05/2022)   Hunger Vital Sign    Worried About Running Out of Food in the Last Year: Never true    Ran Out of Food in the Last Year: Never true  Transportation Needs: No Transportation Needs (02/05/2022)   PRAPARE - Administrator, Civil Service (Medical): No    Lack of Transportation (Non-Medical): No  Physical Activity: Insufficiently Active (02/05/2022)   Exercise Vital Sign    Days of Exercise per Week: 3 days    Minutes of Exercise per Session: 30 min  Stress: No Stress Concern Present (02/05/2022)   [employer] of Occupational Health -  Occupational Stress Questionnaire    Feeling of Stress : Not at all  Social Connections: Moderately Isolated (02/05/2022)   Social Connection and Isolation Panel [NHANES]    Frequency of Communication with Friends and Family: More than three times a week    Frequency of Social Gatherings with Friends and Family: Once a week    Attends Religious Services: Never    Database administrator or Organizations: No    Attends Engineer, structural: Never    Marital Status: Married     Family History: The patient's family history includes Cancer in his mother; Diabetes in his brother; Heart disease in his father; Pulmonary fibrosis in his mother.  ROS:   Please see the history of present illness.   (+) vertigo (+) watery eyes (+) chest pain   All other systems reviewed and are negative.  EKGs/Labs/Other Studies Reviewed:    The following studies were reviewed today: Prior cardiac catheterization reviewed.  CTO RCA.  EKG 02/08/22: SB 57 TWI AL leads, no change 03/08/2021: sinus rhythm 60 pm with t wave inversion, anterior lateral leads  LEFT HEART CATH 09/30/2019:  Prox RCA lesion is 100% stenosed. Ost Cx to Prox Cx lesion is 50% stenosed. 1st Mrg lesion is 40% stenosed. Prox LAD to Mid LAD lesion is 30% stenosed. Dist LAD lesion is 30% stenosed. 1st Diag lesion is 40% stenosed. The left ventricular systolic function is normal. LV end diastolic pressure is normal. The left ventricular ejection fraction is greater than 65% by visual estimate. There is no mitral valve regurgitation.  1. Chronic total occlusion of the proximal RCA. The mid and distal RCA fills briskly from left to right collaterals. The RCA is a large dominant vessel. Collaterals supplied by the LAD 2. There is mild to moderate non-obstructive plaque in the proximal Circumflex and high first obtuse marginal branch. This is a moderate caliber non-dominant vessel.  3. The LAD has mild plaque in the proximal and  mid vessel.  4. Normal LV systolic function    Recent Labs: 05/28/2022: ALT 20  Recent Lipid Panel    Component Value Date/Time   CHOL 126 07/27/2021 1455   TRIG 187 (H) 07/27/2021 1455   HDL 31 (L) 07/27/2021 1455   CHOLHDL 4.1 07/27/2021 1455   CHOLHDL 5.8 07/16/2012 1359   VLDL 36 07/16/2012 1359   LDLCALC 64 07/27/2021 1455     Risk Assessment/Calculations:      Physical Exam:    VS:  BP (!) 116/58   Pulse 60   Ht 5\' 9"  (1.753 m)  Wt 210 lb 3.2 oz (95.3 kg)   SpO2 95%   BMI 31.04 kg/m     Wt Readings from Last 3 Encounters:  07/30/22 210 lb 3.2 oz (95.3 kg)  05/28/22 205 lb (93 kg)  02/08/22 202 lb (91.6 kg)     GEN: Well nourished, well developed, in no acute distress HEENT: normal Neck: no JVD, carotid bruits, or masses Cardiac: RRR; no murmurs, rubs, or gallops,no edema  Respiratory:  clear to auscultation bilaterally, normal work of breathing GI: soft, nontender, nondistended, + BS MS: no deformity or atrophy Skin: warm and dry, no rash Neuro:  Alert and Oriented x 3, Strength and sensation are intact Psych: euthymic mood, full affect   ASSESSMENT:    1. Coronary artery disease involving native coronary artery of native heart without angina pectoris   2. Angina pectoris (HCC)   3. Essential hypertension       PLAN:    In order of problems listed above:   Coronary artery disease involving native coronary artery of native heart without angina pectoris CTO RCA proximally occluded.  Collaterals noted.  Moderate disease elsewhere on heart catheterization 09/2019.  Improved angina, less severe, less frequent. Had an episode when wife had to go to airport at 3am. Cold weather.  Currently taking isosorbide 60 mg as well as Toprol 25.  Heart rate 57.  Feels much better.     Not as severe CP. Since Imdur.   Essential hypertension At prior office visit with Dr. Laural Benes she noticed blood pressure was in the 140/70 range.  Currently it is 120/70.  At  home it was 130 systolic.  Lets continue to monitor at home.  If necessary could always add a low-dose irbesartan or olmesartan.  Stable currently.  Disequilibrium Vertigo-like symptoms occasionally.  Turns head in bed feels movement.  Be careful on ladders.  No recent issues.  Reviewed.  Pure hypercholesterolemia Continue with Crestor 20 mg a day.  No myalgias.  LDL 64 at prior check.  No changes.  Former smoker -Quit in 2020.  Excellent.  Says he drank and smoked for about 50 years.  Family history of coronary artery disease -Father died in his 48s from heart disease.  His brother in his 64s has pulmonary fibrosis and 6 months to live he states.  His mother died of the same thing.    Medication Adjustments/Labs and Tests Ordered: Current medicines are reviewed at length with the patient today.  Concerns regarding medicines are outlined above.  No orders of the defined types were placed in this encounter.  No orders of the defined types were placed in this encounter.   Patient Instructions  Medication Instructions:  The current medical regimen is effective;  continue present plan and medications.  *If you need a refill on your cardiac medications before your next appointment, please call your pharmacy*  Follow-Up: At [facility], you and your health needs are our priority.  As part of our continuing mission to provide you with exceptional heart care, we have created designated Provider Care Teams.  These Care Teams include your primary Cardiologist (physician) and Advanced Practice Providers (APPs -  Physician Assistants and Nurse Practitioners) who all work together to provide you with the care you need, when you need it.  We recommend signing up for the patient portal called "MyChart".  Sign up information is provided on this After Visit Summary.  MyChart is used to connect with patients for Virtual Visits (Telemedicine).  Patients are  able to view lab/test results,  encounter notes, upcoming appointments, etc.  Non-urgent messages can be sent to your provider as well.   To learn more about what you can do with MyChart, go to [email or web address].    Your next appointment:   1 year(s)  Provider:   Donato Schultz, MD           Signed, Donato Schultz, MD  07/30/2022 5:23 PM    [facility]

## 2022-07-30 NOTE — Patient Instructions (Signed)
Medication Instructions:  The current medical regimen is effective;  continue present plan and medications.  *If you need a refill on your cardiac medications before your next appointment, please call your pharmacy*  Follow-Up: At [facility], you and your health needs are our priority.  As part of our continuing mission to provide you with exceptional heart care, we have created designated Provider Care Teams.  These Care Teams include your primary Cardiologist (physician) and Advanced Practice Providers (APPs -  Physician Assistants and Nurse Practitioners) who all work together to provide you with the care you need, when you need it.  We recommend signing up for the patient portal called "MyChart".  Sign up information is provided on this After Visit Summary.  MyChart is used to connect with patients for Virtual Visits (Telemedicine).  Patients are able to view lab/test results, encounter notes, upcoming appointments, etc.  Non-urgent messages can be sent to your provider as well.   To learn more about what you can do with MyChart, go to [email or web address].    Your next appointment:   1 year(s)  Provider:   Mark Skains, MD      

## 2022-08-02 ENCOUNTER — Other Ambulatory Visit: Payer: Self-pay | Admitting: Physician Assistant

## 2022-08-02 DIAGNOSIS — I25118 Atherosclerotic heart disease of native coronary artery with other forms of angina pectoris: Secondary | ICD-10-CM

## 2022-08-02 DIAGNOSIS — E7800 Pure hypercholesterolemia, unspecified: Secondary | ICD-10-CM

## 2022-08-02 NOTE — Telephone Encounter (Signed)
Requested medication (s) are due for refill today- expired Rx  Requested medication (s) are on the active medication list -yes  Future visit scheduled -yes  Last refill: 07/27/21 #90 2RF on both requested Rx  Notes to clinic: expired Rx  Requested Prescriptions  Pending Prescriptions Disp Refills   rosuvastatin (CRESTOR) 20 MG tablet [Pharmacy Med Name: ROSUVASTATIN CALCIUM 20 MG TAB] 90 tablet 2    Sig: TAKE 1 TABLET BY MOUTH EVERY DAY     Cardiovascular:  Antilipid - Statins 2 Failed - 08/02/2022  2:36 AM      Failed - Cr in normal range and within 360 days    Creat  Date Value Ref Range Status  07/16/2012 0.87 0.50 - 1.35 mg/dL Final   Creatinine, Ser  Date Value Ref Range Status  07/27/2021 1.03 0.76 - 1.27 mg/dL Final         Failed - Lipid Panel in normal range within the last 12 months    Cholesterol, Total  Date Value Ref Range Status  07/27/2021 126 100 - 199 mg/dL Final   LDL Chol Calc (NIH)  Date Value Ref Range Status  07/27/2021 64 0 - 99 mg/dL Final   HDL  Date Value Ref Range Status  07/27/2021 31 (L) >39 mg/dL Final   Triglycerides  Date Value Ref Range Status  07/27/2021 187 (H) 0 - 149 mg/dL Final         Passed - Patient is not pregnant      Passed - Valid encounter within last 12 months    Recent Outpatient Visits           2 months ago Type 2 diabetes, diet controlled (HCC)   [name] Casey Blue B, MD   6 months ago Type 2 diabetes, diet controlled [facility])   [place] [facility] Casey Matar, MD   1 year ago Essential hypertension   [place] [facility] [place], [name], [place]   1 year ago Annual physical exam   [place] [facility] Casey Matar, MD   2 years ago Carpal tunnel syndrome, bilateral   [facility] Casey Matar, MD       Future Appointments              In 3 weeks Casey Brunner, DO [place]   In 1 month Casey Matar, MD [place] [facility]             metoprolol succinate (TOPROL-XL) 25 MG 24 hr tablet [Pharmacy Med Name: METOPROLOL SUCC ER 25 MG TAB] 90 tablet 2    Sig: Take 1 tablet (25 mg total) by mouth daily.     Cardiovascular:  Beta Blockers Passed - 08/02/2022  2:36 AM      Passed - Last BP in normal range    BP Readings from Last 1 Encounters:  07/30/22 (!) 116/58         Passed - Last Heart Rate in normal range    Pulse Readings from Last 1 Encounters:  07/30/22 60         Passed - Valid encounter within last 6 months    Recent Outpatient Visits           2 months ago Type 2 diabetes, diet controlled (HCC)   [place] [facility]  Casey Matar, MD   6 months ago Type 2 diabetes, diet controlled [facility])   [place] [facility] Casey Matar, MD   1 year ago Essential hypertension   [place] [facility] [place], [name], [place]   1 year ago Annual physical exam   [place] [facility] Casey Blue B, MD   2 years ago Carpal tunnel syndrome, bilateral   [place] [facility] Casey Matar, MD       Future Appointments             In 3 weeks Casey Brunner, DO [place]   In 1 month Laural James, Casey Rail, MD [facility]               Requested Prescriptions  Pending Prescriptions Disp Refills   rosuvastatin (CRESTOR) 20 MG tablet [Pharmacy Med Name: ROSUVASTATIN CALCIUM 20 MG TAB] 90 tablet 2    Sig: TAKE 1 TABLET BY MOUTH EVERY DAY     Cardiovascular:  Antilipid - Statins 2 Failed - 08/02/2022  2:36 AM      Failed - Cr in normal range and within 360 days    Creat  Date Value Ref Range Status  07/16/2012 0.87 0.50 - 1.35 mg/dL Final   Creatinine,  Ser  Date Value Ref Range Status  07/27/2021 1.03 0.76 - 1.27 mg/dL Final         Failed - Lipid Panel in normal range within the last 12 months    Cholesterol, Total  Date Value Ref Range Status  07/27/2021 126 100 - 199 mg/dL Final   LDL Chol Calc (NIH)  Date Value Ref Range Status  07/27/2021 64 0 - 99 mg/dL Final   HDL  Date Value Ref Range Status  07/27/2021 31 (L) >39 mg/dL Final   Triglycerides  Date Value Ref Range Status  07/27/2021 187 (H) 0 - 149 mg/dL Final         Passed - Patient is not pregnant      Passed - Valid encounter within last 12 months    Recent Outpatient Visits           2 months ago Type 2 diabetes, diet controlled (HCC)   [place] [facility] Casey Blue B, MD   6 months ago Type 2 diabetes, diet controlled [facility])   [place] [facility] Casey Matar, MD   1 year ago Essential hypertension   [place] [facility] [place], [name], [place]   1 year ago Annual physical exam   [place] [facility] Casey Matar, MD   2 years ago Carpal tunnel syndrome, bilateral   [place] [facility] Casey Matar, MD       Future Appointments             In 3 weeks Casey Brunner, DO [place]   In 1 month Casey Matar, MD  [facility]             metoprolol succinate (TOPROL-XL) 25 MG 24 hr tablet [Pharmacy Med Name: METOPROLOL SUCC ER 25 MG TAB] 90 tablet 2    Sig: Take 1 tablet (25 mg total) by mouth daily.     Cardiovascular:  Beta Blockers Passed -  08/02/2022  2:36 AM      Passed - Last BP in normal range    BP Readings from Last 1 Encounters:  07/30/22 (!) 116/58         Passed - Last Heart Rate in normal range    Pulse Readings from Last 1 Encounters:  07/30/22 60         Passed - Valid encounter within last 6 months    Recent  Outpatient Visits           2 months ago Type 2 diabetes, diet controlled (HCC)   [place] [facility] Casey Blue B, MD   6 months ago Type 2 diabetes, diet controlled [facility])   [place] [facility] Casey Matar, MD   1 year ago Essential hypertension   [facility] [place], [name], [place]   1 year ago Annual physical exam   [facility] Casey Matar, MD   2 years ago Carpal tunnel syndrome, bilateral    [facility] Casey Matar, MD       Future Appointments             In 3 weeks Casey Brunner, DO  [place]   In 1 month Laural James, Casey Rail, MD [facility]

## 2022-08-23 ENCOUNTER — Ambulatory Visit: Payer: 59 | Admitting: Sports Medicine

## 2022-09-27 ENCOUNTER — Encounter: Payer: Self-pay | Admitting: Internal Medicine

## 2022-09-27 ENCOUNTER — Ambulatory Visit: Payer: 59 | Attending: Internal Medicine | Admitting: Internal Medicine

## 2022-09-27 VITALS — BP 150/72 | HR 64 | Temp 98.2°F | Ht 69.0 in | Wt 209.0 lb

## 2022-09-27 DIAGNOSIS — E1159 Type 2 diabetes mellitus with other circulatory complications: Secondary | ICD-10-CM

## 2022-09-27 DIAGNOSIS — I25118 Atherosclerotic heart disease of native coronary artery with other forms of angina pectoris: Secondary | ICD-10-CM | POA: Diagnosis not present

## 2022-09-27 DIAGNOSIS — I152 Hypertension secondary to endocrine disorders: Secondary | ICD-10-CM

## 2022-09-27 DIAGNOSIS — E119 Type 2 diabetes mellitus without complications: Secondary | ICD-10-CM

## 2022-09-27 MED ORDER — ROSUVASTATIN CALCIUM 20 MG PO TABS
20.0000 mg | ORAL_TABLET | Freq: Every day | ORAL | 1 refills | Status: DC
Start: 2022-09-27 — End: 2023-04-23

## 2022-09-27 MED ORDER — METOPROLOL SUCCINATE ER 25 MG PO TB24
25.0000 mg | ORAL_TABLET | Freq: Every day | ORAL | 1 refills | Status: DC
Start: 2022-09-27 — End: 2023-04-23

## 2022-09-27 MED ORDER — NITROGLYCERIN 0.4 MG SL SUBL
SUBLINGUAL_TABLET | SUBLINGUAL | 1 refills | Status: DC
Start: 2022-09-27 — End: 2022-10-22

## 2022-09-27 MED ORDER — ISOSORBIDE MONONITRATE ER 60 MG PO TB24
60.0000 mg | ORAL_TABLET | Freq: Every day | ORAL | 3 refills | Status: DC
Start: 2022-09-27 — End: 2023-09-13

## 2022-09-27 NOTE — Progress Notes (Signed)
Patient ID: Casey James, male    DOB: 1955-08-16  MRN: [id number]  CC: Hypertension (HTN f/u. Med refill. Franchot Erichsen possibility of blood donation/)   Subjective: Casey James is a 67 y.o. male who presents for chronic disease management His concerns today include:  GERD, former smoker, HL, HTN, obesity, marijuana use, CAD, DM type 2.     Wants to donate blood when there is a blood drive.  Use to do it a lot when younger.  Wants to know if it is okay to donate blood with the medicines that he is on.   DM: Lab Results  Component Value Date   HGBA1C 6.4 (H) 05/28/2022  Diet control.  "I try to eat healthy."  Less salt Has not exercised in a few mths due to tennis elbow; tennis elbow has resolved.  Saw sports med and had inj   HTN/CAD/HL: Saw Dr. Anne Fu 07/2022. Occasional CP; no recent SL Nitroglycerine use. No SOB, LE edema He is on isosorbide 60 mg daily, metoprolol XL 25 mg daily, Crestor 20 mg daily, aspirin 81 mg daily. Taking consistently; Did not take BP meds as yet for today because he has not eaten as yet for today.  BP this a.m at home was 139/76.  Checks BP 3x/wk.  Reports SBP in 130s and DP in the 70s.  BP with Dr. Anne Fu in June was 116/58  Patient Active Problem List   Diagnosis Date Noted   Benign paroxysmal positional vertigo of left ear 03/14/2021   Disequilibrium 03/08/2021   Pure hypercholesterolemia 03/08/2021   Influenza vaccine refused 01/12/2020   Coronary artery disease involving native coronary artery of native heart without angina pectoris    Essential hypertension 09/08/2019   Obesity (BMI 30-39.9) 07/28/2019   Marijuana user 07/28/2019   Chest pain in adult 06/23/2019   Gastroesophageal reflux disease without esophagitis 06/23/2019   Former smoker 06/23/2019   Ptosis of eyelid, right 07/13/2011     Current Outpatient Medications on File Prior to Visit  Medication Sig Dispense Refill   aspirin EC 81 MG tablet Take 81 mg by mouth daily.      Multiple Vitamins-Minerals (CENTRUM) tablet Take 1 tablet by mouth daily.      No current facility-administered medications on file prior to visit.    No Known Allergies  Social History   Socioeconomic History   Marital status: Married    Spouse name: Not on file   Number of children: Not on file   Years of education: Not on file   Highest education level: Not on file  Occupational History   Not on file  Tobacco Use   Smoking status: Former    Current packs/day: 0.00    Types: Cigarettes    Start date: 10/20/1968    Quit date: 10/21/2018    Years since quitting: 3.9   Smokeless tobacco: Never  Vaping Use   Vaping status: Never Used  Substance and Sexual Activity   Alcohol use: Not Currently   Drug use: Never   Sexual activity: Not on file  Other Topics Concern   Not on file  Social History Narrative   Not on file   Social Determinants of Health   Financial Resource Strain: Low Risk  (02/05/2022)   Overall Financial Resource Strain (CARDIA)    Difficulty of Paying Living Expenses: Not hard at all  Food Insecurity: No Food Insecurity (02/05/2022)   Hunger Vital Sign    Worried About Programme researcher, broadcasting/film/video in  the Last Year: Never true    Ran Out of Food in the Last Year: Never true  Transportation Needs: No Transportation Needs (02/05/2022)   PRAPARE - Administrator, Civil Service (Medical): No    Lack of Transportation (Non-Medical): No  Physical Activity: Insufficiently Active (02/05/2022)   Exercise Vital Sign    Days of Exercise per Week: 3 days    Minutes of Exercise per Session: 30 min  Stress: No Stress Concern Present (02/05/2022)   [employer] of Occupational Health - Occupational Stress Questionnaire    Feeling of Stress : Not at all  Social Connections: Moderately Isolated (02/05/2022)   Social Connection and Isolation Panel [NHANES]    Frequency of Communication with Friends and Family: More than three times a week    Frequency of  Social Gatherings with Friends and Family: Once a week    Attends Religious Services: Never    Database administrator or Organizations: No    Attends Banker Meetings: Never    Marital Status: Married  Catering manager Violence: Not At Risk (02/05/2022)   Humiliation, Afraid, Rape, and Kick questionnaire    Fear of Current or Ex-Partner: No    Emotionally Abused: No    Physically Abused: No    Sexually Abused: No    Family History  Problem Relation Age of Onset   Cancer Mother    Pulmonary fibrosis Mother    Diabetes Brother    Heart disease Father     Past Surgical History:  Procedure Laterality Date   CHOLECYSTECTOMY     FRACTURE SURGERY     LEFT HEART CATH AND CORONARY ANGIOGRAPHY N/A 09/30/2019   Procedure: LEFT HEART CATH AND CORONARY ANGIOGRAPHY;  Surgeon: Kathleene Hazel, MD;  Location: [facility];  Service: Cardiovascular;  Laterality: N/A;   pulmonary fibrosis      ROS: Review of Systems Negative except as stated above  PHYSICAL EXAM: BP (!) 150/72   Pulse 64   Temp 98.2 F (36.8 C) (Oral)   Ht 5\' 9"  (1.753 m)   Wt 209 lb (94.8 kg)   SpO2 96%   BMI 30.86 kg/m   Physical Exam   General appearance - alert, well appearing, older Caucasian male and in no distress Mental status - normal mood, behavior, speech, dress, motor activity, and thought processes Neck - supple, no significant adenopathy Chest - clear to auscultation, no wheezes, rales or rhonchi, symmetric air entry Heart - normal rate, regular rhythm, normal S1, S2, no murmurs, rubs, clicks or gallops Extremities - peripheral pulses normal, no pedal edema, no clubbing or cyanosis     Latest Ref Rng & Units 05/28/2022    3:10 PM 07/27/2021    2:55 PM 03/14/2021    4:15 PM  CMP  Glucose 70 - 99 mg/dL  272  536   BUN 8 - 27 mg/dL  11  10   Creatinine 6.44 - 1.27 mg/dL  0.34  7.42   Sodium 595 - 144 mmol/L  143  141   Potassium 3.5 - 5.2 mmol/L  4.7  4.7   Chloride 96 -  106 mmol/L  103  102   CO2 20 - 29 mmol/L  23  25   Calcium 8.6 - 10.2 mg/dL  9.4  9.1   Total Protein 6.0 - 8.5 g/dL 7.2   6.9   Total Bilirubin 0.0 - 1.2 mg/dL 1.2   0.8   Alkaline Phos 44 -  121 IU/L 78   83   AST 0 - 40 IU/L 21   20   ALT 0 - 44 IU/L 20   21    Lipid Panel     Component Value Date/Time   CHOL 126 07/27/2021 1455   TRIG 187 (H) 07/27/2021 1455   HDL 31 (L) 07/27/2021 1455   CHOLHDL 4.1 07/27/2021 1455   CHOLHDL 5.8 07/16/2012 1359   VLDL 36 07/16/2012 1359   LDLCALC 64 07/27/2021 1455    CBC    Component Value Date/Time   WBC 10.0 03/14/2021 1615   WBC 11.1 (H) 07/16/2012 1359   RBC 5.13 03/14/2021 1615   RBC 5.12 07/16/2012 1359   HGB 15.6 03/14/2021 1615   HCT 45.8 03/14/2021 1615   PLT 219 03/14/2021 1615   MCV 89 03/14/2021 1615   MCH 30.4 03/14/2021 1615   MCH 30.7 07/16/2012 1359   MCHC 34.1 03/14/2021 1615   MCHC 34.7 07/16/2012 1359   RDW 13.6 03/14/2021 1615   LYMPHSABS 2.4 07/16/2012 1359   MONOABS 0.6 07/16/2012 1359   EOSABS 0.1 07/16/2012 1359   BASOSABS 0.0 07/16/2012 1359    ASSESSMENT AND PLAN:  1. Type 2 diabetes, diet controlled (HCC) Diet control.  Encouraged him to continue healthy eating habits.  He plans to start walking again for exercise. - Hemoglobin A1c  2. Hypertension associated with diabetes (HCC) Not at goal due to the fact that he has not taken his medicines as yet for today.  He plans to take them when he returns home.  Reported home blood pressure readings have been good.  He will continue Toprol 25 mg daily and isosorbide 60 mg daily. - CBC - Basic metabolic panel - metoprolol succinate (TOPROL-XL) 25 MG 24 hr tablet; Take 1 tablet (25 mg total) by mouth daily.  Dispense: 90 tablet; Refill: 1  3. Coronary artery disease of native artery of native heart with stable angina pectoris (HCC) Stable.  Continue isosorbide 60 mg daily, metoprolol XL 25 mg daily, aspirin and Crestor. Advised patient that he can donate  blood provided that he is not anemic as anemia in pts with CAD can cause chest pains - nitroGLYCERIN (NITROSTAT) 0.4 MG SL tablet; DISSOLVE ONE TABLET UNDER THE TONGUE EVERY 5 MINUTES AS NEEDED FOR CHEST PAIN(IF PAIN PERSIST AFTER 3 DOSES CALL EMS)  Dispense: 25 tablet; Refill: 1 - isosorbide mononitrate (IMDUR) 60 MG 24 hr tablet; Take 1 tablet (60 mg total) by mouth daily.  Dispense: 90 tablet; Refill: 3 - metoprolol succinate (TOPROL-XL) 25 MG 24 hr tablet; Take 1 tablet (25 mg total) by mouth daily.  Dispense: 90 tablet; Refill: 1 - rosuvastatin (CRESTOR) 20 MG tablet; Take 1 tablet (20 mg total) by mouth daily.  Dispense: 90 tablet; Refill: 1    Patient was given the opportunity to ask questions.  Patient verbalized understanding of the plan and was able to repeat key elements of the plan.   This documentation was completed using Paediatric nurse.  Any transcriptional errors are unintentional.  Orders Placed This Encounter  Procedures   CBC   Basic metabolic panel   Hemoglobin A1c     Requested Prescriptions   Signed Prescriptions Disp Refills   nitroGLYCERIN (NITROSTAT) 0.4 MG SL tablet 25 tablet 1    Sig: DISSOLVE ONE TABLET UNDER THE TONGUE EVERY 5 MINUTES AS NEEDED FOR CHEST PAIN(IF PAIN PERSIST AFTER 3 DOSES CALL EMS)   isosorbide mononitrate (IMDUR) 60 MG 24 hr tablet  90 tablet 3    Sig: Take 1 tablet (60 mg total) by mouth daily.   metoprolol succinate (TOPROL-XL) 25 MG 24 hr tablet 90 tablet 1    Sig: Take 1 tablet (25 mg total) by mouth daily.   rosuvastatin (CRESTOR) 20 MG tablet 90 tablet 1    Sig: Take 1 tablet (20 mg total) by mouth daily.    No follow-ups on file.  Jonah Blue, MD, FACP

## 2022-10-20 ENCOUNTER — Other Ambulatory Visit: Payer: Self-pay | Admitting: Internal Medicine

## 2022-10-20 DIAGNOSIS — I25118 Atherosclerotic heart disease of native coronary artery with other forms of angina pectoris: Secondary | ICD-10-CM

## 2022-11-22 ENCOUNTER — Encounter: Payer: Self-pay | Admitting: Sports Medicine

## 2022-11-22 ENCOUNTER — Ambulatory Visit: Payer: 59 | Admitting: Sports Medicine

## 2022-11-22 ENCOUNTER — Other Ambulatory Visit: Payer: Self-pay

## 2022-11-22 DIAGNOSIS — M25522 Pain in left elbow: Secondary | ICD-10-CM | POA: Diagnosis not present

## 2022-11-22 DIAGNOSIS — G8929 Other chronic pain: Secondary | ICD-10-CM

## 2022-11-22 DIAGNOSIS — M7712 Lateral epicondylitis, left elbow: Secondary | ICD-10-CM | POA: Diagnosis not present

## 2022-11-22 NOTE — Progress Notes (Signed)
Patient says that he was feeling good after his last appointment and physical therapy until cutting and moving trees in early August. He says that this made his elbow pain return and it has not gotten better since early August. Patient continues to do home exercises but has not gotten any relief.

## 2022-11-22 NOTE — Progress Notes (Signed)
Office Visit Note   Patient: Casey James           Date of Birth: 1955-11-28           MRN: [id number] Visit Date: 11/22/2022              Requested by: [name], MD [address] [place],  [place] [address] PCP: [name], MD  Medical Resident, Sports Medicine Fellow - Attending Physician Addendum:   I have independently interviewed and examined the patient myself. I have discussed the above with the original author and agree with their documentation. My edits for correction/addition/clarification have been made, see any changes above and below.   In summary, pleasant 67 year old male presents for reexacerbation of chronic underlying lateral epicondylitis with likely superimposed partial tearing.  Had essentially 100% pain relief after previous corticosteroid injection as well as home rehab.  Chainsaw use and physical labor greatly aggravated his pain.  Through shared decision making did proceed with ultrasound-guided lateral epicondyle injection, patient tolerated well.  Discussed we cannot repeat these frequently given the injection around the tendon, he is understanding and still wanted to proceed with injection.  Advised modified rest, ice, Tylenol and infrequent Aleve use for pain control.  He will start back on his home rehab exercises starting on Monday and perform once daily.  He will follow-up with me as needed.  Additional treatment considerations: Nitroglycerin patch protocol, shockwave therapy, last resort would be surgical tendon debridement.  [name], DO Primary Care Sports Medicine Physician  [facility] - Orthopedics  Assessment & Plan: Visit Diagnoses:  1. Lateral epicondylitis, left elbow   2. Chronic elbow pain, left     Plan: Given patient's lateral epicondylitis flareup, discussed with patient the risk and benefits of repeating steroid injection versus conservative therapy at this time.  Patient states that he feels  like he did not get much relief conservative therapy and that is pain is keeping him up at night.  Patient would like to go ahead with steroid injection and will limit how much he uses his left elbow/arm going forward.  Discussed the concern regarding tendon rupture and how this will likely be the last injection for the next year that the patient can receive over his lateral epicondyle area and patient would like to continue with the injection. Patient tolerated the procedure well without any difficulties.  Patient was advised to continue resting the affected elbow and may continue with low-dose of NSAIDs as directed from his cardiologist.  He can follow-up as needed and can continue with at home PT exercises over the next 1 week or so.  Patient is understanding and agreeable with plan.  Follow-Up Instructions: Return if symptoms worsen or fail to improve.   Orders:  Orders Placed This Encounter  Procedures   US Guided Needle Placement - No Linked Charges   No orders of the defined types were placed in this encounter.     Procedures: No procedures performed  Procedure: Lateral Epicondylitis (Common Extensor Tendon) Injection, Left Elbow After informed verbal consent was obtained, a timeout was performed.  Patient was lying supine on exam table.  Area overlying the affected lateral epicondyle was prepped with chloraprep and alcohol swabs. Then utilizing ultrasound guidance via an in-plane approach, the patient's lateral epicondyle and common extensor tendon was injected with 1:1:1 lidocaine:bupivicaine:betamethasone with multiple needle fenestrations from a distal to proximal direction. Patient tolerated procedure well without immediate complications.  Clinical Data:  No additional findings.   Subjective: Chief Complaint  Patient presents with   Left Elbow - Pain    Patient notes that his lateral epicondylitis was essentially healed and he had felt really good after the previous steroid  injection as well as 5 sessions of physical therapy.  Patient states that a tree fell in his yard and on his car so he had to use a chainsaw to get rid of him.  Patient said after using chainsaw he had moderate to severe pain of the lateral epicondyle on the left side.  Patient thought that the pain would go away but states that it has not gone away since the original incident.  Patient states that this happened at the beginning of August and his pain has been the same ever since.  Patient has been trying some of the physical therapy exercises with minimal relief.  Patient does have a history of CAD and was advised to not use NSAIDs as much.  Patient did use prednisone prior to receiving his previous steroid injections and notes that that did not help very much.    Review of Systems   Objective:  Physical Exam  Ortho Exam Elbow, Left: Inspection yields no evidence of bony deformity, effusion, erythema, ecchymosis, or rash. Active and passive ROM intact in flexion/extension/supination/pronation though pain noted with passive and active pronation. Strength 5/5 throughout. There is significant TTP at the lateral epicondyle, no tenderness over the medial malleolus. There is pain with 3rd digit and wrist extension against resistance over the lateral epicondyle. Milld pain with gripping or finger/wrist flexion against resistance.  Special Tests:   - Polk's (book) Test: NEG   - [place]: POS  Specialty Comments:  No specialty comments available.  Imaging: No results found.   PMFS History: Patient Active Problem List   Diagnosis Date Noted   Benign paroxysmal positional vertigo of left ear 03/14/2021   Disequilibrium 03/08/2021   Pure hypercholesterolemia 03/08/2021   Influenza vaccine refused 01/12/2020   Coronary artery disease involving native coronary artery of native heart without angina pectoris    Essential hypertension 09/08/2019   Obesity (BMI 30-39.9) 07/28/2019   Marijuana user  07/28/2019   Chest pain in adult 06/23/2019   Gastroesophageal reflux disease without esophagitis 06/23/2019   Former smoker 06/23/2019   Ptosis of eyelid, right 07/13/2011   Past Medical History:  Diagnosis Date   Chest pain    Emphysema of lung (HCC)    Former smoker    GERD (gastroesophageal reflux disease)    Hypertension     Family History  Problem Relation Age of Onset   Cancer Mother    Pulmonary fibrosis Mother    Diabetes Brother    Heart disease Father     Past Surgical History:  Procedure Laterality Date   CHOLECYSTECTOMY     FRACTURE SURGERY     LEFT HEART CATH AND CORONARY ANGIOGRAPHY N/A 09/30/2019   Procedure: LEFT HEART CATH AND CORONARY ANGIOGRAPHY;  Surgeon: Kathleene Hazel, MD;  Location: [facility];  Service: Cardiovascular;  Laterality: N/A;   pulmonary fibrosis     Social History   Occupational History   Not on file  Tobacco Use   Smoking status: Former    Current packs/day: 0.00    Types: Cigarettes    Start date: 10/20/1968    Quit date: 10/21/2018    Years since quitting: 4.0   Smokeless tobacco: Never  Vaping Use   Vaping status: Never Used  Substance  and Sexual Activity   Alcohol use: Not Currently   Drug use: Never   Sexual activity: Not on file

## 2023-01-31 ENCOUNTER — Ambulatory Visit: Payer: 59 | Admitting: Internal Medicine

## 2023-02-06 ENCOUNTER — Encounter: Payer: Self-pay | Admitting: Internal Medicine

## 2023-02-13 ENCOUNTER — Telehealth: Payer: Self-pay | Admitting: Internal Medicine

## 2023-02-13 DIAGNOSIS — E119 Type 2 diabetes mellitus without complications: Secondary | ICD-10-CM

## 2023-02-13 NOTE — Telephone Encounter (Signed)
Let patient know that I received eye exam report from [facility] eye [facility].  Last eye exam was March 2023.  Did he get his DM eye exam this year?  If not, we can refer him to get this done.  Needs to be done once a year in persons with diabetes.

## 2023-04-21 ENCOUNTER — Other Ambulatory Visit: Payer: Self-pay | Admitting: Internal Medicine

## 2023-04-21 DIAGNOSIS — I25118 Atherosclerotic heart disease of native coronary artery with other forms of angina pectoris: Secondary | ICD-10-CM

## 2023-04-21 DIAGNOSIS — I152 Hypertension secondary to endocrine disorders: Secondary | ICD-10-CM

## 2023-05-16 ENCOUNTER — Other Ambulatory Visit: Payer: Self-pay | Admitting: Internal Medicine

## 2023-05-16 ENCOUNTER — Encounter: Payer: Self-pay | Admitting: Internal Medicine

## 2023-05-16 DIAGNOSIS — E1159 Type 2 diabetes mellitus with other circulatory complications: Secondary | ICD-10-CM

## 2023-05-16 DIAGNOSIS — I25118 Atherosclerotic heart disease of native coronary artery with other forms of angina pectoris: Secondary | ICD-10-CM

## 2023-05-17 ENCOUNTER — Other Ambulatory Visit: Payer: Self-pay | Admitting: Internal Medicine

## 2023-05-17 DIAGNOSIS — I25118 Atherosclerotic heart disease of native coronary artery with other forms of angina pectoris: Secondary | ICD-10-CM

## 2023-05-17 DIAGNOSIS — I152 Hypertension secondary to endocrine disorders: Secondary | ICD-10-CM

## 2023-05-17 MED ORDER — METOPROLOL SUCCINATE ER 25 MG PO TB24: 25.0000 mg | ORAL_TABLET | Freq: Every day | ORAL | 1 refills | Status: DC

## 2023-05-17 MED ORDER — ROSUVASTATIN CALCIUM 20 MG PO TABS: 20.0000 mg | ORAL_TABLET | Freq: Every day | ORAL | 1 refills | Status: DC

## 2023-05-17 NOTE — Telephone Encounter (Signed)
 Patient was given courtesy fill last month. His appointment is not until May

## 2023-07-11 ENCOUNTER — Ambulatory Visit: Attending: Internal Medicine | Admitting: Internal Medicine

## 2023-07-11 ENCOUNTER — Encounter: Payer: Self-pay | Admitting: Internal Medicine

## 2023-07-11 VITALS — BP 136/72 | HR 60 | Temp 97.9°F | Ht 69.0 in | Wt 218.0 lb

## 2023-07-11 DIAGNOSIS — Z0000 Encounter for general adult medical examination without abnormal findings: Secondary | ICD-10-CM | POA: Diagnosis not present

## 2023-07-11 DIAGNOSIS — Z2821 Immunization not carried out because of patient refusal: Secondary | ICD-10-CM

## 2023-07-11 DIAGNOSIS — H6122 Impacted cerumen, left ear: Secondary | ICD-10-CM

## 2023-07-11 DIAGNOSIS — I25118 Atherosclerotic heart disease of native coronary artery with other forms of angina pectoris: Secondary | ICD-10-CM | POA: Diagnosis not present

## 2023-07-11 DIAGNOSIS — I152 Hypertension secondary to endocrine disorders: Secondary | ICD-10-CM

## 2023-07-11 DIAGNOSIS — I10 Essential (primary) hypertension: Secondary | ICD-10-CM

## 2023-07-11 DIAGNOSIS — Z6832 Body mass index (BMI) 32.0-32.9, adult: Secondary | ICD-10-CM

## 2023-07-11 DIAGNOSIS — E669 Obesity, unspecified: Secondary | ICD-10-CM | POA: Diagnosis not present

## 2023-07-11 DIAGNOSIS — E1159 Type 2 diabetes mellitus with other circulatory complications: Secondary | ICD-10-CM

## 2023-07-11 DIAGNOSIS — E66811 Obesity, class 1: Secondary | ICD-10-CM

## 2023-07-11 DIAGNOSIS — Z125 Encounter for screening for malignant neoplasm of prostate: Secondary | ICD-10-CM

## 2023-07-11 LAB — POCT GLYCOSYLATED HEMOGLOBIN (HGB A1C): HbA1c, POC (controlled diabetic range): 6.9 % (ref 0.0–7.0)

## 2023-07-11 LAB — GLUCOSE, POCT (MANUAL RESULT ENTRY): POC Glucose: 120 mg/dL — AB (ref 70–99)

## 2023-07-11 NOTE — Patient Instructions (Addendum)
 VISIT SUMMARY:  You came in today for your annual physical exam. We discussed your diabetes and coronary artery disease management, as well as your general health maintenance. Your diabetes is being managed with diet control, and your coronary artery disease is being managed with medications. We also reviewed your recent weight loss and physical activity levels.  YOUR PLAN:  -TYPE 2 DIABETES MELLITUS WITHOUT COMPLICATIONS: Type 2 diabetes is a condition where your body does not use insulin properly, leading to high blood sugar levels. Your Hemoglobin A1c has increased to 6.9, which is close to the target of less than 7.0. We discussed the importance of diet, exercise, and weight loss in managing your diabetes. You should aim to lose 5-7% of your current body weight and make dietary changes to reduce sugar intake, including choosing fruits with lower sugar content. We will also order a diabetic eye exam and a urine microalbumin test, and perform a diabetes foot exam.  -CORONARY ARTERY DISEASE: Coronary artery disease is a condition where the blood vessels supplying your heart are narrowed or blocked. Your condition is being managed with isosorbide , toprol , aspirin , and rosuvastatin . You reported intermittent chest pain that is relieved by rest, but no recent use of nitroglycerin . Continue taking your medications as prescribed and follow up with cardiology next month.  -TENNIS ELBOW (LATERAL EPICONDYLITIS): Tennis elbow is a condition that causes pain around the outside of the elbow. Your symptoms have improved with previous injections and increased activity, and you are not experiencing any current pain.  -GENERAL HEALTH MAINTENANCE: We discussed general health maintenance, including the importance of routine screenings and vaccinations. You declined colon cancer screening and the pneumonia and shingles vaccines. You are due for a diabetic eye exam and a urine microalbumin test. We will also order  baseline blood tests to check your cholesterol, kidney, liver function, and PSA levels.  INSTRUCTIONS:  Please follow up with cardiology next month as scheduled. Additionally, make sure to complete the diabetic eye exam and urine microalbumin test. Continue with your current medications and aim to lose 5-7% of your current body weight through diet and exercise. We will also perform baseline blood tests to monitor your overall health.  Preventive Care 55 Years and Older, Male Preventive care refers to lifestyle choices and visits with your health care provider that can promote health and wellness. Preventive care visits are also called wellness exams. What can I expect for my preventive care visit? Counseling During your preventive care visit, your health care provider may ask about your: Medical history, including: Past medical problems. Family medical history. History of falls. Current health, including: Emotional well-being. Home life and relationship well-being. Sexual activity. Memory and ability to understand (cognition). Lifestyle, including: Alcohol, nicotine or tobacco, and drug use. Access to firearms. Diet, exercise, and sleep habits. Work and work Astronomer. Sunscreen use. Safety issues such as seatbelt and bike helmet use. Physical exam Your health care provider will check your: Height and weight. These may be used to calculate your BMI (body mass index). BMI is a measurement that tells if you are at a healthy weight. Waist circumference. This measures the distance around your waistline. This measurement also tells if you are at a healthy weight and may help predict your risk of certain diseases, such as type 2 diabetes and high blood pressure. Heart rate and blood pressure. Body temperature. Skin for abnormal spots. What immunizations do I need?  Vaccines are usually given at various ages, according to a schedule. Your health  care provider will recommend vaccines  for you based on your age, medical history, and lifestyle or other factors, such as travel or where you work. What tests do I need? Screening Your health care provider may recommend screening tests for certain conditions. This may include: Lipid and cholesterol levels. Diabetes screening. This is done by checking your blood sugar (glucose) after you have not eaten for a while (fasting). Hepatitis C test. Hepatitis B test. HIV (human immunodeficiency virus) test. STI (sexually transmitted infection) testing, if you are at risk. Lung cancer screening. Colorectal cancer screening. Prostate cancer screening. Abdominal aortic aneurysm (AAA) screening. You may need this if you are a current or former smoker. Talk with your health care provider about your test results, treatment options, and if necessary, the need for more tests. Follow these instructions at home: Eating and drinking  Eat a diet that includes fresh fruits and vegetables, whole grains, lean protein, and low-fat dairy products. Limit your intake of foods with high amounts of sugar, saturated fats, and salt. Take vitamin and mineral supplements as recommended by your health care provider. Do not drink alcohol if your health care provider tells you not to drink. If you drink alcohol: Limit how much you have to 0-2 drinks a day. Know how much alcohol is in your drink. In the [place] one drink equals one 12 oz bottle of beer (355 mL), one 5 oz glass of wine (148 mL), or one 1 oz glass of hard liquor (44 mL). Lifestyle Brush your teeth every morning and night with fluoride toothpaste. Floss one time each day. Exercise for at least 30 minutes 5 or more days each week. Do not use any products that contain nicotine or tobacco. These products include cigarettes, chewing tobacco, and vaping devices, such as e-cigarettes. If you need help quitting, ask your health care provider. Do not use drugs. If you are sexually active, practice safe  sex. Use a condom or other form of protection to prevent STIs. Take aspirin  only as told by your health care provider. Make sure that you understand how much to take and what form to take. Work with your health care provider to find out whether it is safe and beneficial for you to take aspirin  daily. Ask your health care provider if you need to take a cholesterol-lowering medicine (statin). Find healthy ways to manage stress, such as: Meditation, yoga, or listening to music. Journaling. Talking to a trusted person. Spending time with friends and family. Safety Always wear your seat belt while driving or riding in a vehicle. Do not drive: If you have been drinking alcohol. Do not ride with someone who has been drinking. When you are tired or distracted. While texting. If you have been using any mind-altering substances or drugs. Wear a helmet and other protective equipment during sports activities. If you have firearms in your house, make sure you follow all gun safety procedures. Minimize exposure to UV radiation to reduce your risk of skin cancer. What's next? Visit your health care provider once a year for an annual wellness visit. Ask your health care provider how often you should have your eyes and teeth checked. Stay up to date on all vaccines. This information is not intended to replace advice given to you by your health care provider. Make sure you discuss any questions you have with your health care provider. Document Revised: 08/10/2020 Document Reviewed: 08/10/2020 Elsevier Patient Education  2024 [employer].

## 2023-07-11 NOTE — Progress Notes (Signed)
 Patient ID: Casey James, male    DOB: 11/29/1955  MRN: [id number]  CC: Annual Exam (Physical. /No questions / concerns/No to all vax)   Subjective: Casey James is a 68 y.o. male who presents for annual exam His concerns today include:  GERD, former smoker, HL, HTN, obesity, marijuana use, CAD, DM type 2.     Discussed the use of AI scribe software for clinical note transcription with the patient, who gave verbal consent to proceed.  History of Present Illness GRADEY James is a 68 year old male with diabetes and coronary artery disease who presents for an annual physical exam.  DM: Results for orders placed or performed in visit on 07/11/23  POCT glucose (manual entry)   Collection Time: 07/11/23  3:05 PM  Result Value Ref Range   POC Glucose 120 (A) 70 - 99 mg/dl  POCT glycosylated hemoglobin (Hb A1C)   Collection Time: 07/11/23  3:10 PM  Result Value Ref Range   Hemoglobin A1C     HbA1c POC (<> result, manual entry)     HbA1c, POC (prediabetic range)     HbA1c, POC (controlled diabetic range) 6.9 0.0 - 7.0 %    Diabetes is managed with diet control. Hemoglobin A1c is 6.9, increased from 6.3 last year, with a target below 7. He eats once or twice daily, typically yogurt in the morning and supper, skipping lunch. He avoids salt and fatty foods, eats more fish, and drinks one soda a day. He consumes more water than other beverages and eats fresh fruits, mostly oranges. He has lost six pounds since April and becomes more active in warmer weather, walking three times a week about a quarter of a mile.  CAD/HTN:  Coronary artery disease is managed with isosorbide  60 mg daily, toprol  25 mg daily, aspirin , and rosuvastatin  20 mg daily. Chest pain occurs in cold weather, resolving with rest and deep breathing.  No recent chest pain, shortness of breath, or use of nitroglycerin . A cardiology appointment is scheduled next month.     Patient Active Problem List   Diagnosis Date  Noted   Benign paroxysmal positional vertigo of left ear 03/14/2021   Disequilibrium 03/08/2021   Pure hypercholesterolemia 03/08/2021   Influenza vaccine refused 01/12/2020   Coronary artery disease involving native coronary artery of native heart without angina pectoris    Essential hypertension 09/08/2019   Obesity (BMI 30-39.9) 07/28/2019   Marijuana user 07/28/2019   Chest pain in adult 06/23/2019   Gastroesophageal reflux disease without esophagitis 06/23/2019   Former smoker 06/23/2019   Ptosis of eyelid, right 07/13/2011     Current Outpatient Medications on File Prior to Visit  Medication Sig Dispense Refill   aspirin  EC 81 MG tablet Take 81 mg by mouth daily.     isosorbide  mononitrate (IMDUR ) 60 MG 24 hr tablet Take 1 tablet (60 mg total) by mouth daily. 90 tablet 3   metoprolol  succinate (TOPROL -XL) 25 MG 24 hr tablet Take 1 tablet (25 mg total) by mouth daily. 30 tablet 1   Multiple Vitamins-Minerals (CENTRUM) tablet Take 1 tablet by mouth daily.      nitroGLYCERIN  (NITROSTAT ) 0.4 MG SL tablet DISSOLVE ONE TABLET UNDER THE TONGUE EVERY 5 MINUTES AS NEEDED FOR CHEST PAIN(IF PAIN PERSIST AFTER 3 DOSES CALL EMS) 75 tablet 0   rosuvastatin  (CRESTOR ) 20 MG tablet Take 1 tablet (20 mg total) by mouth daily. 30 tablet 1   No current facility-administered medications on file prior  to visit.    No Known Allergies  Social History   Socioeconomic History   Marital status: Married    Spouse name: Not on file   Number of children: Not on file   Years of education: Not on file   Highest education level: Not on file  Occupational History   Not on file  Tobacco Use   Smoking status: Former    Current packs/day: 0.00    Types: Cigarettes    Start date: 10/20/1968    Quit date: 10/21/2018    Years since quitting: 4.7   Smokeless tobacco: Never  Vaping Use   Vaping status: Never Used  Substance and Sexual Activity   Alcohol use: Not Currently   Drug use: Never   Sexual  activity: Not on file  Other Topics Concern   Not on file  Social History Narrative   Not on file   Social Drivers of Health   Financial Resource Strain: Low Risk  (02/05/2022)   Overall Financial Resource Strain (CARDIA)    Difficulty of Paying Living Expenses: Not hard at all  Food Insecurity: No Food Insecurity (02/05/2022)   Hunger Vital Sign    Worried About Running Out of Food in the Last Year: Never true    Ran Out of Food in the Last Year: Never true  Transportation Needs: No Transportation Needs (02/05/2022)   PRAPARE - Administrator, Civil Service (Medical): No    Lack of Transportation (Non-Medical): No  Physical Activity: Insufficiently Active (02/05/2022)   Exercise Vital Sign    Days of Exercise per Week: 3 days    Minutes of Exercise per Session: 30 min  Stress: No Stress Concern Present (02/05/2022)   [employer] of Occupational Health - Occupational Stress Questionnaire    Feeling of Stress : Not at all  Social Connections: Moderately Isolated (02/05/2022)   Social Connection and Isolation Panel [NHANES]    Frequency of Communication with Friends and Family: More than three times a week    Frequency of Social Gatherings with Friends and Family: Once a week    Attends Religious Services: Never    Database administrator or Organizations: No    Attends Banker Meetings: Never    Marital Status: Married  Catering manager Violence: Not At Risk (02/05/2022)   Humiliation, Afraid, Rape, and Kick questionnaire    Fear of Current or Ex-Partner: No    Emotionally Abused: No    Physically Abused: No    Sexually Abused: No    Family History  Problem Relation Age of Onset   Cancer Mother    Pulmonary fibrosis Mother    Diabetes Brother    Heart disease Father     Past Surgical History:  Procedure Laterality Date   CHOLECYSTECTOMY     FRACTURE SURGERY     LEFT HEART CATH AND CORONARY ANGIOGRAPHY N/A 09/30/2019   Procedure: LEFT  HEART CATH AND CORONARY ANGIOGRAPHY;  Surgeon: Odie Benne, MD;  Location: [facility];  Service: Cardiovascular;  Laterality: N/A;   pulmonary fibrosis      ROS: Review of Systems  HENT:  Negative for congestion, hearing loss, sore throat and trouble swallowing.   Eyes:        He wears bifocals.  He is overdue for diabetic eye exam.  Respiratory:  Negative for cough and shortness of breath.   Cardiovascular:  Negative for leg swelling.  Gastrointestinal:  Negative for abdominal pain and blood  in stool.  Genitourinary:  Negative for hematuria.  Musculoskeletal:        Had tennis elbow in the left elbow.  Saw orthopedics for it and was given an injection.  Elbow is much better now.  Skin:        Has a small scab-like lesion on the right side of the forehead.  States that this appeared reasonably but has had similar ones appear and then they will go away on their own.     PHYSICAL EXAM: BP 136/72 (BP Location: Left Arm, Patient Position: Sitting, Cuff Size: Normal)   Pulse 60   Temp 97.9 F (36.6 C) (Oral)   Ht 5\' 9"  (1.753 m)   Wt 218 lb (98.9 kg)   SpO2 95%   BMI 32.19 kg/m   Physical Exam   General appearance - alert, well appearing, older Caucasian male in NAD and in no distress Mental status - normal mood, behavior, speech, dress, motor activity, and thought processes Eyes - pupils equal and reactive, extraocular eye movements intact Ears -small amount of hard wax buildup in the left ear obscuring view of tympanic membrane.  Right ear canal and membrane within normal limits. Nose - normal and patent, no erythema, discharge or polyps Mouth - mucous membranes moist, pharynx normal without lesions Neck - supple, no significant adenopathy Lymphatics - no palpable lymphadenopathy, no hepatosplenomegaly Chest - clear to auscultation, no wheezes, rales or rhonchi, symmetric air entry Heart - normal rate, regular rhythm, normal S1, S2, no murmurs, rubs, clicks  or gallops Abdomen - soft, nontender, nondistended, no masses or organomegaly Musculoskeletal - no joint tenderness, deformity or swelling Extremities - peripheral pulses normal, no pedal edema, no clubbing or cyanosis Skin -small dry raised lesion on the right forehead less than 0.5 cm in size     Latest Ref Rng & Units 09/27/2022    2:46 PM 05/28/2022    3:10 PM 07/27/2021    2:55 PM  CMP  Glucose 70 - 99 mg/dL 94   161   BUN 8 - 27 mg/dL 10   11   Creatinine 0.96 - 1.27 mg/dL 0.45   4.09   Sodium 811 - 144 mmol/L 142   143   Potassium 3.5 - 5.2 mmol/L 4.5   4.7   Chloride 96 - 106 mmol/L 106   103   CO2 20 - 29 mmol/L 23   23   Calcium  8.6 - 10.2 mg/dL 9.3   9.4   Total Protein 6.0 - 8.5 g/dL  7.2    Total Bilirubin 0.0 - 1.2 mg/dL  1.2    Alkaline Phos 44 - 121 IU/L  78    AST 0 - 40 IU/L  21    ALT 0 - 44 IU/L  20     Lipid Panel     Component Value Date/Time   CHOL 126 07/27/2021 1455   TRIG 187 (H) 07/27/2021 1455   HDL 31 (L) 07/27/2021 1455   CHOLHDL 4.1 07/27/2021 1455   CHOLHDL 5.8 07/16/2012 1359   VLDL 36 07/16/2012 1359   LDLCALC 64 07/27/2021 1455    CBC    Component Value Date/Time   WBC 8.9 09/27/2022 1446   WBC 11.1 (H) 07/16/2012 1359   RBC 5.24 09/27/2022 1446   RBC 5.12 07/16/2012 1359   HGB 16.1 09/27/2022 1446   HCT 46.4 09/27/2022 1446   PLT 178 09/27/2022 1446   MCV 89 09/27/2022 1446   MCH 30.7 09/27/2022 1446  MCH 30.7 07/16/2012 1359   MCHC 34.7 09/27/2022 1446   MCHC 34.7 07/16/2012 1359   RDW 13.6 09/27/2022 1446   LYMPHSABS 2.4 07/16/2012 1359   MONOABS 0.6 07/16/2012 1359   EOSABS 0.1 07/16/2012 1359   BASOSABS 0.0 07/16/2012 1359    ASSESSMENT AND PLAN: 1. Annual physical exam (Primary)   2. Type 2 diabetes mellitus with other circulatory complication, without long-term current use of insulin (HCC) Diet control.  We discussed adding low-dose of metformin  given that his A1c is inching closer to 7 but patient declined.  States  that he will continue to work on eating habits and exercise. - POCT glycosylated hemoglobin (Hb A1C) - POCT glucose (manual entry) - Ambulatory referral to Ophthalmology - CBC - Comprehensive metabolic panel with GFR - Microalbumin / creatinine urine ratio  3. Obesity (BMI 30.0-34.9) Patient advised to eliminate sugary drinks from the diet, cut back on portion sizes especially of white carbohydrates, eat more white lean meat like chicken [place] and seafood instead of beef or pork and incorporate fresh fruits and vegetables into the diet daily. Encouraged to continue regular exercise  4. Hypertension associated with diabetes (HCC) At goal.  Continue Topamax 25 mg daily  5. Coronary artery disease of native artery of native heart with stable angina pectoris (HCC) Stable.  Continue isosorbide  60 mg daily, Topamax 25 mg daily, aspirin  and Crestor  20 mg daily. - Lipid panel  6. Impacted cerumen, left ear Advised to purchase wax softener over-the-counter and use it for several days  7. Pneumococcal vaccination declined Recommended.  Patient declined  8. Herpes zoster vaccination declined Recommended.  Patient declined  9. Prostate cancer screening - PSA    Patient was given the opportunity to ask questions.  Patient verbalized understanding of the plan and was able to repeat key elements of the plan.   This documentation was completed using Paediatric nurse.  Any transcriptional errors are unintentional.  Orders Placed This Encounter  Procedures   POCT glycosylated hemoglobin (Hb A1C)   POCT glucose (manual entry)     Requested Prescriptions    No prescriptions requested or ordered in this encounter    No follow-ups on file.  Concetta Dee, MD, FACP

## 2023-07-12 ENCOUNTER — Other Ambulatory Visit: Payer: Self-pay | Admitting: Internal Medicine

## 2023-07-12 ENCOUNTER — Ambulatory Visit: Payer: Self-pay | Admitting: Internal Medicine

## 2023-07-12 DIAGNOSIS — I25118 Atherosclerotic heart disease of native coronary artery with other forms of angina pectoris: Secondary | ICD-10-CM

## 2023-07-12 DIAGNOSIS — E1159 Type 2 diabetes mellitus with other circulatory complications: Secondary | ICD-10-CM

## 2023-07-12 LAB — CBC
Hematocrit: 47.4 % (ref 37.5–51.0)
Hemoglobin: 15.9 g/dL (ref 13.0–17.7)
MCH: 30.8 pg (ref 26.6–33.0)
MCHC: 33.5 g/dL (ref 31.5–35.7)
MCV: 92 fL (ref 79–97)
Platelets: 206 10*3/uL (ref 150–450)
RBC: 5.16 x10E6/uL (ref 4.14–5.80)
RDW: 13.7 % (ref 11.6–15.4)
WBC: 9.1 10*3/uL (ref 3.4–10.8)

## 2023-07-12 LAB — LIPID PANEL
Chol/HDL Ratio: 3.6 ratio (ref 0.0–5.0)
Cholesterol, Total: 116 mg/dL (ref 100–199)
HDL: 32 mg/dL — ABNORMAL LOW (ref >39)
LDL Chol Calc (NIH): 58 mg/dL (ref 0–99)
Triglycerides: 153 mg/dL — ABNORMAL HIGH (ref 0–149)
VLDL Cholesterol Cal: 26 mg/dL (ref 5–40)

## 2023-07-12 LAB — COMPREHENSIVE METABOLIC PANEL WITH GFR
ALT: 26 IU/L (ref 0–44)
AST: 26 IU/L (ref 0–40)
Albumin: 4.3 g/dL (ref 3.9–4.9)
Alkaline Phosphatase: 77 IU/L (ref 44–121)
BUN/Creatinine Ratio: 9 — ABNORMAL LOW (ref 10–24)
BUN: 9 mg/dL (ref 8–27)
Bilirubin Total: 0.9 mg/dL (ref 0.0–1.2)
CO2: 22 mmol/L (ref 20–29)
Calcium: 9.2 mg/dL (ref 8.6–10.2)
Chloride: 102 mmol/L (ref 96–106)
Creatinine, Ser: 0.97 mg/dL (ref 0.76–1.27)
Globulin, Total: 2.3 g/dL (ref 1.5–4.5)
Glucose: 99 mg/dL (ref 70–99)
Potassium: 4.5 mmol/L (ref 3.5–5.2)
Sodium: 139 mmol/L (ref 134–144)
Total Protein: 6.6 g/dL (ref 6.0–8.5)
eGFR: 85 mL/min/{1.73_m2} (ref >59)

## 2023-07-12 LAB — PSA: Prostate Specific Ag, Serum: 1.3 ng/mL (ref 0.0–4.0)

## 2023-07-12 LAB — MICROALBUMIN / CREATININE URINE RATIO
Creatinine, Urine: 47.7 mg/dL
Microalb/Creat Ratio: <6 mg/g{creat} (ref 0–29)
Microalbumin, Urine: <3 ug/mL

## 2023-08-16 ENCOUNTER — Ambulatory Visit (HOSPITAL_BASED_OUTPATIENT_CLINIC_OR_DEPARTMENT_OTHER): Admitting: Cardiology

## 2023-08-16 ENCOUNTER — Encounter (HOSPITAL_BASED_OUTPATIENT_CLINIC_OR_DEPARTMENT_OTHER): Payer: Self-pay | Admitting: Cardiology

## 2023-08-16 VITALS — BP 132/72 | HR 61 | Ht 69.0 in | Wt 215.4 lb

## 2023-08-16 DIAGNOSIS — I2510 Atherosclerotic heart disease of native coronary artery without angina pectoris: Secondary | ICD-10-CM

## 2023-08-16 DIAGNOSIS — I209 Angina pectoris, unspecified: Secondary | ICD-10-CM

## 2023-08-16 DIAGNOSIS — I10 Essential (primary) hypertension: Secondary | ICD-10-CM | POA: Diagnosis not present

## 2023-08-16 DIAGNOSIS — I25118 Atherosclerotic heart disease of native coronary artery with other forms of angina pectoris: Secondary | ICD-10-CM | POA: Diagnosis not present

## 2023-08-16 NOTE — Patient Instructions (Signed)
 Medication Instructions:  Your physician recommends that you continue on your current medications as directed. Please refer to the Current Medication list given to you today.   Follow-Up: Your next appointment:   1 year with APP

## 2023-08-16 NOTE — Progress Notes (Signed)
 Cardiology Office Note:  .   Date:  08/16/2023  ID:  Casey James, DOB [birth year], MRN [id number] PCP: Lawrance Presume, MD  [facility] Providers Cardiologist:  Dorothye Gathers, MD    History of Present Illness: .   Casey James is a 68 y.o. male Discussed the use of AI scribe software for clinical note transcription with the patient, who gave verbal consent to proceed.  History of Present Illness Casey James is a 68 year old male with coronary artery disease who presents for a follow-up visit.  He has a history of coronary artery disease with a chronic total occlusion of the right coronary artery, identified during catheterization on February 17, 2020. His angina has significantly improved with medication, although he occasionally experiences chest pain, which is no more frequent than in the past couple of years. He initially did not tolerate isosorbide  but has since adjusted to it. His current medications include Crestor  20 mg daily, Toprol  25 mg daily, isosorbide  60 mg daily, and aspirin  81 mg daily.  He is a former smoker who quit in August 30, 2018. His father died in his seventies from heart disease.  During a recent physical with his primary care doctor, his LDL cholesterol was 58 mg/dL, creatinine was 0.9 mg/dL, and N8G was 9.5%. He has been consuming a lot of oranges and was unaware of the high sugar content in fruits like oranges and watermelon.      ROS: No SOB  Studies Reviewed: Aaron Aas   EKG Interpretation Date/Time:  Friday August 16 2023 13:23:37 EDT Ventricular Rate:  61 PR Interval:  158 QRS Duration:  98 QT Interval:  448 QTC Calculation: 450 R Axis:   -18  Text Interpretation: Normal sinus rhythm Left ventricular hypertrophy with repolarization abnormality ( R in aVL ) When compared with ECG of 09-Aug-2007 01:28, ST now depressed in Anterolateral leads T wave inversion now evident in Anterolateral leads Confirmed by Dorothye Gathers ([id number]) on 08/16/2023 1:25:23 PM     Results LABS LDL cholesterol: 58 mg/dL Creatinine: 0.9 mg/dL Q6V: 7.8%  DIAGNOSTIC Cardiac catheterization (02/17/2020): Chronic total occlusion (CTO) of the right coronary artery (RCA) approximately occluded with collaterals noted, moderate disease elsewhere EKG: T wave inversions Risk Assessment/Calculations:            Physical Exam:   VS:  BP 132/72   Pulse 61   Ht 5' 9 (1.753 m)   Wt 215 lb 6.4 oz (97.7 kg)   SpO2 96%   BMI 31.81 kg/m    Wt Readings from Last 3 Encounters:  08/16/23 215 lb 6.4 oz (97.7 kg)  07/11/23 218 lb (98.9 kg)  09/27/22 209 lb (94.8 kg)    GEN: Well nourished, well developed in no acute distress NECK: No JVD; No carotid bruits CARDIAC:RRR, no murmurs, no rubs, no gallops RESPIRATORY:  Clear to auscultation without rales, wheezing or rhonchi  ABDOMEN: Soft, non-tender, non-distended EXTREMITIES:  No edema; No deformity   ASSESSMENT AND PLAN: .    Assessment and Plan Assessment & Plan Coronary artery disease with angina pectoris Cath 2019/08/30  Diagnostic Dominance: Right  Chronic total occlusion of the right coronary artery with collateral circulation. Angina improved with current medication regimen. Occasional chest pain, but well-managed over the last couple of years. Previous heart catheterization showed good collateral flow, reducing the need for further intervention. Further intervention could cause more harm than benefit due to the good collateral circulation. - Continue current medications: Aspirin  81 mg daily, Isosorbide   mononitrate 60 mg daily, Metoprolol  succinate 25 mg daily, Rosuvastatin  20 mg daily.  Essential hypertension Blood pressure well-controlled at 132/72 mmHg with current medication regimen. - Continue current antihypertensive regimen.  Pure hypercholesterolemia LDL cholesterol at 58 mg/dL, well-controlled with current statin therapy. - Continue Rosuvastatin  20 mg daily.  Type 2 diabetes mellitus A1c at 6.9%,  indicating diabetes. Dietary modifications recommended to manage blood glucose levels. - Implement dietary changes to reduce sugar intake.  History of smoking Former smoker, quit in 2020. No current smoking-related issues. - Encourage continued abstinence from smoking.           Signed, Dorothye Gathers, MD

## 2023-08-22 DIAGNOSIS — H524 Presbyopia: Secondary | ICD-10-CM | POA: Diagnosis not present

## 2023-08-22 DIAGNOSIS — H35033 Hypertensive retinopathy, bilateral: Secondary | ICD-10-CM | POA: Diagnosis not present

## 2023-08-22 DIAGNOSIS — H02401 Unspecified ptosis of right eyelid: Secondary | ICD-10-CM | POA: Diagnosis not present

## 2023-08-23 ENCOUNTER — Encounter (HOSPITAL_BASED_OUTPATIENT_CLINIC_OR_DEPARTMENT_OTHER): Admitting: Internal Medicine

## 2023-08-23 DIAGNOSIS — M109 Gout, unspecified: Secondary | ICD-10-CM

## 2023-08-23 MED ORDER — COLCHICINE 0.6 MG PO TABS: ORAL_TABLET | ORAL | 0 refills | Status: AC

## 2023-08-23 NOTE — Telephone Encounter (Signed)
Please see the MyChart message reply(ies) for my assessment and plan.    This patient gave consent for this Medical Advice Message and is aware that it may result in a bill to their insurance company, as well as the possibility of receiving a bill for a co-payment or deductible. They are an established patient, but are not seeking medical advice exclusively about a problem treated during an in person or video visit in the last seven days. I did not recommend an in person or video visit within seven days of my reply.    I spent a total of 5 minutes cumulative time within 7 days through MyChart messaging.  Mariem Skolnick, MD   

## 2023-09-13 ENCOUNTER — Other Ambulatory Visit: Payer: Self-pay | Admitting: Internal Medicine

## 2023-09-13 DIAGNOSIS — I25118 Atherosclerotic heart disease of native coronary artery with other forms of angina pectoris: Secondary | ICD-10-CM

## 2023-11-28 NOTE — Progress Notes (Signed)
 Casey James                                          MRN: [id number]   11/28/2023   The [facility] Quality Team Specialist reviewed this patient medical record for the purposes of chart review for care gap closure. The following were reviewed: chart review for care gap closure-colorectal cancer screening.    [facility]I Quality Team

## 2024-01-04 ENCOUNTER — Other Ambulatory Visit: Payer: Self-pay | Admitting: Internal Medicine

## 2024-01-04 DIAGNOSIS — I25118 Atherosclerotic heart disease of native coronary artery with other forms of angina pectoris: Secondary | ICD-10-CM

## 2024-01-04 DIAGNOSIS — E1159 Type 2 diabetes mellitus with other circulatory complications: Secondary | ICD-10-CM

## 2024-01-06 NOTE — Telephone Encounter (Signed)
 Requested Prescriptions  Pending Prescriptions Disp Refills   metoprolol  succinate (TOPROL -XL) 25 MG 24 hr tablet [Pharmacy Med Name: METOPROLOL  SUCC ER 25 MG TAB] 90 tablet 0    Sig: TAKE 1 TABLET (25 MG TOTAL) BY MOUTH DAILY.     Cardiovascular:  Beta Blockers Passed - 01/06/2024  1:56 PM      Passed - Last BP in normal range    BP Readings from Last 1 Encounters:  08/16/23 132/72         Passed - Last Heart Rate in normal range    Pulse Readings from Last 1 Encounters:  08/16/23 61         Passed - Valid encounter within last 6 months    Recent Outpatient Visits           5 months ago Annual physical exam   [facility] [place]. [facility] Barnie NOVAK, MD   1 year ago Type 2 diabetes, diet controlled [facility])   [facility] [place]. [facility] Barnie NOVAK, MD   1 year ago Type 2 diabetes, diet controlled [facility])   [facility] Dept Of [place]. [facility] Barnie NOVAK, MD   1 year ago Type 2 diabetes, diet controlled [facility])   [facility] Dept Of [place]. [facility] Barnie NOVAK, MD   2 years ago Essential hypertension   [facility] - [facility] Dept Of [place]. [facility], Jon M, PA-C               rosuvastatin  (CRESTOR ) 20 MG tablet [Pharmacy Med Name: ROSUVASTATIN  CALCIUM  20 MG TAB] 90 tablet 1    Sig: TAKE 1 TABLET BY MOUTH EVERY DAY     Cardiovascular:  Antilipid - Statins 2 Failed - 01/06/2024  1:56 PM      Failed - Lipid Panel in normal range within the last 12 months    Cholesterol, Total  Date Value Ref Range Status  07/11/2023 116 100 - 199 mg/dL Final   LDL Chol Calc (NIH)  Date Value Ref Range Status  07/11/2023 58 0 - 99 mg/dL Final   HDL  Date Value Ref Range Status  07/11/2023 32 (L) >39 mg/dL Final   Triglycerides  Date Value Ref Range  Status  07/11/2023 153 (H) 0 - 149 mg/dL Final         Passed - Cr in normal range and within 360 days    Creat  Date Value Ref Range Status  07/16/2012 0.87 0.50 - 1.35 mg/dL Final   Creatinine, Ser  Date Value Ref Range Status  07/11/2023 0.97 0.76 - 1.27 mg/dL Final         Passed - Patient is not pregnant      Passed - Valid encounter within last 12 months    Recent Outpatient Visits           5 months ago Annual physical exam   [place] [facility] [place]. [facility] Barnie NOVAK, MD   1 year ago Type 2 diabetes, diet controlled [facility])    [facility] [place]. [facility] Barnie B, MD   1 year ago Type 2 diabetes, diet controlled (HCC)  [facility] - [facility] Dept Of [place]. [facility] Barnie NOVAK, MD   1 year ago Type 2 diabetes, diet controlled [facility])   [facility] [facility] Dept Of [place]. [facility] Barnie NOVAK, MD   2 years ago Essential hypertension   [facility] [place] - A Dept Of [place]. [facility] [place], [place], PA-C

## 2024-01-13 ENCOUNTER — Ambulatory Visit: Admitting: Internal Medicine

## 2024-03-17 ENCOUNTER — Encounter: Payer: Self-pay | Admitting: Internal Medicine

## 2024-03-17 ENCOUNTER — Ambulatory Visit: Attending: Internal Medicine | Admitting: Internal Medicine

## 2024-03-17 VITALS — BP 142/77 | HR 60 | Temp 98.2°F | Ht 69.0 in | Wt 219.0 lb

## 2024-03-17 DIAGNOSIS — I25118 Atherosclerotic heart disease of native coronary artery with other forms of angina pectoris: Secondary | ICD-10-CM | POA: Diagnosis not present

## 2024-03-17 DIAGNOSIS — E669 Obesity, unspecified: Secondary | ICD-10-CM | POA: Diagnosis not present

## 2024-03-17 DIAGNOSIS — Z5320 Procedure and treatment not carried out because of patient's decision for unspecified reasons: Secondary | ICD-10-CM | POA: Diagnosis not present

## 2024-03-17 DIAGNOSIS — E1159 Type 2 diabetes mellitus with other circulatory complications: Secondary | ICD-10-CM | POA: Diagnosis not present

## 2024-03-17 DIAGNOSIS — Z6832 Body mass index (BMI) 32.0-32.9, adult: Secondary | ICD-10-CM

## 2024-03-17 DIAGNOSIS — I10 Essential (primary) hypertension: Secondary | ICD-10-CM

## 2024-03-17 LAB — POCT GLYCOSYLATED HEMOGLOBIN (HGB A1C): HbA1c, POC (controlled diabetic range): 7.1 % — AB (ref 0.0–7.0)

## 2024-03-17 LAB — GLUCOSE, POCT (MANUAL RESULT ENTRY): POC Glucose: 174 mg/dL — AB (ref 70–99)

## 2024-03-17 NOTE — Patient Instructions (Signed)
" °  VISIT SUMMARY: Casey James, a 69 year old male with diabetes, hypertension, and hyperlipidemia, came in for a follow-up on his chronic conditions. His A1c has increased slightly, and his blood pressure is slightly elevated. He prefers to manage his diabetes through lifestyle changes rather than medication. He is also due for a diabetic eye exam and colon cancer screening.  YOUR PLAN: -TYPE 2 DIABETES MELLITUS: Your A1c level has increased to 7.1, which is above the target range. This means your blood sugar levels are higher than desired. We discussed making dietary changes such as reducing sugary drinks, increasing water intake, consuming lean meats, and reducing white carbohydrates. Additionally, I encourage you to engage in regular exercise, such as indoor walking for 15-20 minutes several days a week. We will recheck your A1c at your next visit.  -ATHEROSCLEROTIC HEART DISEASE WITH STABLE ANGINA: Your blood pressure is typically in the 130s/60s, with a goal of 130/80 or lower. You are currently taking metoprolol  XL 25 mg daily and isosorbide  60 mg daily, which you should continue. I encourage you to monitor your blood pressure regularly and make lifestyle modifications, including exercise and dietary changes.  -COLON CANCER SCREENING DECLINED: You have declined the Cologuard test due to concerns about false positives and insurance. Your last Cologuard test in 2021 was negative. We discussed the insurance implications and the option of a colonoscopy. You may consider a referral for a colonoscopy if you desire.  INSTRUCTIONS: Please schedule a follow-up appointment to recheck your A1c. Continue monitoring your blood pressure regularly and maintain the recommended lifestyle changes. Your diabetic eye exam is scheduled for June. If you change your mind about colon cancer screening, let us  know to discuss further options.    Contains text generated by [employer]bridge.   "

## 2024-03-17 NOTE — Progress Notes (Signed)
 "   Patient ID: Casey James, male    DOB: [birth year]  MRN: [id number]  CC: Diabetes (DM f/u. Med refills./No questions / concerns/No to all vax. No to Cologuard)   Subjective: Casey James is a 69 y.o. male who presents for chronic ds management. His chronic medical issues include:  GERD, former smoker, HL, HTN, obesity, marijuana use, CAD, DM type 2.     Discussed the use of AI scribe software for clinical note transcription with the patient, who gave verbal consent to proceed.  History of Present Illness BELL CAI is a 69 year old male with diabetes, hypertension, and hyperlipidemia who presents for follow-up of his chronic medical conditions.  DM:  Results for orders placed or performed in visit on 03/17/24  POCT glycosylated hemoglobin (Hb A1C)   Collection Time: 03/17/24  3:01 PM  Result Value Ref Range   Hemoglobin A1C     HbA1c POC (<> result, manual entry)     HbA1c, POC (prediabetic range)     HbA1c, POC (controlled diabetic range) 7.1 (A) 0.0 - 7.0 %  POCT glucose (manual entry)   Collection Time: 03/17/24  3:13 PM  Result Value Ref Range   POC Glucose 174 (A) 70 - 99 mg/dl  His J8r has increased from 6.9 to 7.1. His blood sugar today was 174, and he last ate yogurt and a pack of crackers this morning.  Does not have a device to check blood sugar and is not interested at this time.  He is not currently on medication for diabetes and prefers to manage his condition through lifestyle changes. His weight has remained stable at 219 pounds, up from 218 pounds at the last visit. He feels he is doing 'pretty good' with his eating habits over the holidays and does not engage in much exercise, citing cold weather as a barrier.  HTN/CAD: His blood pressure today was 145/67. He is currently taking metoprolol  XL 25 mg daily and isosorbide  60 mg daily, both of which he takes daily and has taken today. He checks his blood pressure regularly, noting a range of 130s/60s at home. He  limits his salt intake, although he finds it challenging. For his hyperlipidemia and heart disease, he is taking aspirin  and rosuvastatin . No chest pain or shortness of breath. His last cardiologist visit was in June, and his last labs were done in May of the previous year.  HM: He is due for a diabetic eye exam, which is scheduled for June. He is also due for colon cancer screening.  Did the Cologuard kit the last time but is not interested in doing that again for fear that if it is positive his insurance may not cover for colonoscopy.  I then discussed colonoscopy with him but he declines on this as well.  He is declining colon cancer screening for now.    Patient Active Problem List   Diagnosis Date Noted   Benign paroxysmal positional vertigo of left ear 03/14/2021   Disequilibrium 03/08/2021   Pure hypercholesterolemia 03/08/2021   Influenza vaccine refused 01/12/2020   Coronary artery disease involving native coronary artery of native heart without angina pectoris    Essential hypertension 09/08/2019   Obesity (BMI 30-39.9) 07/28/2019   Marijuana user 07/28/2019   Chest pain in adult 06/23/2019   Gastroesophageal reflux disease without esophagitis 06/23/2019   Former smoker 06/23/2019   Ptosis of eyelid, right 07/13/2011     Medications Ordered Prior to Encounter[1]  Allergies[2]  Social  History   Socioeconomic History   Marital status: Married    Spouse name: Not on file   Number of children: Not on file   Years of education: Not on file   Highest education level: Not on file  Occupational History   Not on file  Tobacco Use   Smoking status: Former    Current packs/day: 0.00    Types: Cigarettes    Start date: 10/20/1968    Quit date: 10/21/2018    Years since quitting: 5.4   Smokeless tobacco: Never  Vaping Use   Vaping status: Never Used  Substance and Sexual Activity   Alcohol use: Not Currently   Drug use: Never   Sexual activity: Not on file  Other Topics  Concern   Not on file  Social History Narrative   Not on file   Social Drivers of Health   Tobacco Use: Medium Risk (03/17/2024)   Patient History    Smoking Tobacco Use: Former    Smokeless Tobacco Use: Never    Passive Exposure: Not on Actuary Strain: Low Risk (02/05/2022)   Overall Financial Resource Strain (CARDIA)    Difficulty of Paying Living Expenses: Not hard at all  Food Insecurity: No Food Insecurity (02/05/2022)   Hunger Vital Sign    Worried About Running Out of Food in the Last Year: Never true    Ran Out of Food in the Last Year: Never true  Transportation Needs: No Transportation Needs (02/05/2022)   PRAPARE - Administrator, Civil Service (Medical): No    Lack of Transportation (Non-Medical): No  Physical Activity: Insufficiently Active (02/05/2022)   Exercise Vital Sign    Days of Exercise per Week: 3 days    Minutes of Exercise per Session: 30 min  Stress: No Stress Concern Present (02/05/2022)   [employer] of Occupational Health - Occupational Stress Questionnaire    Feeling of Stress : Not at all  Social Connections: Moderately Isolated (02/05/2022)   Social Connection and Isolation Panel    Frequency of Communication with Friends and Family: More than three times a week    Frequency of Social Gatherings with Friends and Family: Once a week    Attends Religious Services: Never    Database Administrator or Organizations: No    Attends Banker Meetings: Never    Marital Status: Married  Catering Manager Violence: Not At Risk (02/05/2022)   Humiliation, Afraid, Rape, and Kick questionnaire    Fear of Current or Ex-Partner: No    Emotionally Abused: No    Physically Abused: No    Sexually Abused: No  Depression (PHQ2-9): Low Risk (03/17/2024)   Depression (PHQ2-9)    PHQ-2 Score: 0  Alcohol Screen: Low Risk (02/05/2022)   Alcohol Screen    Last Alcohol Screening Score (AUDIT): 0  Housing: Low Risk  (02/05/2022)   Housing    Last Housing Risk Score: 0  Utilities: Not At Risk (02/05/2022)   AHC Utilities    Threatened with loss of utilities: No  Health Literacy: Not on file    Family History  Problem Relation Age of Onset   Cancer Mother    Pulmonary fibrosis Mother    Diabetes Brother    Heart disease Father     Past Surgical History:  Procedure Laterality Date   CHOLECYSTECTOMY     FRACTURE SURGERY     LEFT HEART CATH AND CORONARY ANGIOGRAPHY N/A 09/30/2019   Procedure: LEFT HEART  CATH AND CORONARY ANGIOGRAPHY;  Surgeon: Verlin Lonni BIRCH, MD;  Location: [facility];  Service: Cardiovascular;  Laterality: N/A;   pulmonary fibrosis      ROS: Review of Systems Negative except as stated above  PHYSICAL EXAM: BP (!) 142/77   Pulse 60   Temp 98.2 F (36.8 C) (Oral)   Ht 5' 9 (1.753 m)   Wt 219 lb (99.3 kg)   SpO2 96%   BMI 32.34 kg/m   Wt Readings from Last 3 Encounters:  03/17/24 219 lb (99.3 kg)  08/16/23 215 lb 6.4 oz (97.7 kg)  07/11/23 218 lb (98.9 kg)    Physical Exam   General appearance - alert, well appearing, older Caucasian male and in no distress Mental status - normal mood, behavior, speech, dress, motor activity, and thought processes Neck - supple, no significant adenopathy Chest - clear to auscultation, no wheezes, rales or rhonchi, symmetric air entry Heart - normal rate, regular rhythm, normal S1, S2, no murmurs, rubs, clicks or gallops Extremities - peripheral pulses normal, no pedal edema, no clubbing or cyanosis     Latest Ref Rng & Units 07/11/2023    4:01 PM 09/27/2022    2:46 PM 05/28/2022    3:10 PM  CMP  Glucose 70 - 99 mg/dL 99  94    BUN 8 - 27 mg/dL 9  10    Creatinine 9.23 - 1.27 mg/dL 9.02  8.96    Sodium 865 - 144 mmol/L 139  142    Potassium 3.5 - 5.2 mmol/L 4.5  4.5    Chloride 96 - 106 mmol/L 102  106    CO2 20 - 29 mmol/L 22  23    Calcium  8.6 - 10.2 mg/dL 9.2  9.3    Total Protein 6.0 - 8.5 g/dL 6.6    7.2   Total Bilirubin 0.0 - 1.2 mg/dL 0.9   1.2   Alkaline Phos 44 - 121 IU/L 77   78   AST 0 - 40 IU/L 26   21   ALT 0 - 44 IU/L 26   20    Lipid Panel     Component Value Date/Time   CHOL 116 07/11/2023 1601   TRIG 153 (H) 07/11/2023 1601   HDL 32 (L) 07/11/2023 1601   CHOLHDL 3.6 07/11/2023 1601   CHOLHDL 5.8 07/16/2012 1359   VLDL 36 07/16/2012 1359   LDLCALC 58 07/11/2023 1601    CBC    Component Value Date/Time   WBC 9.1 07/11/2023 1601   WBC 11.1 (H) 07/16/2012 1359   RBC 5.16 07/11/2023 1601   RBC 5.12 07/16/2012 1359   HGB 15.9 07/11/2023 1601   HCT 47.4 07/11/2023 1601   PLT 206 07/11/2023 1601   MCV 92 07/11/2023 1601   MCH 30.8 07/11/2023 1601   MCH 30.7 07/16/2012 1359   MCHC 33.5 07/11/2023 1601   MCHC 34.7 07/16/2012 1359   RDW 13.7 07/11/2023 1601   LYMPHSABS 2.4 07/16/2012 1359   MONOABS 0.6 07/16/2012 1359   EOSABS 0.1 07/16/2012 1359   BASOSABS 0.0 07/16/2012 1359    ASSESSMENT AND PLAN: 1. Type 2 diabetes mellitus in patient with obesity (HCC) (Primary) Diabetes previously controlled with diet alone.  A1c is slightly above goal today.  Discussed putting him on low-dose of metformin  but patient declines medicine at this time.  Prefers to focus on lifestyle changes.  He will continue trying to eat healthy and plans to try to move more.  Discussed exercise that he can do indoors like marching in place for 20 to 30 minutes daily since it is too cold to walk outside. - POCT glucose (manual entry) - POCT glycosylated hemoglobin (Hb A1C)  2. Hypertension associated with diabetes (HCC) Not at goal.  Home blood pressure readings also stated to be above goal.  Discussed adding another blood pressure medication to the Toprol  and isosorbide  but patient declined.  DASH diet encouraged.  He will continue Toprol -XL 25 mg daily and isosorbide  60 mg daily.  3. Coronary artery disease of native artery of native heart with stable angina pectoris Stable.  Continue  Toprol  25 mg daily, isosorbide  60 mg daily, rosuvastatin  20 mg daily and aspirin  81 mg daily.  4. Colon cancer screening declined Strongly advised colon cancer screening but patient declined.  Patient was given the opportunity to ask questions.  Patient verbalized understanding of the plan and was able to repeat key elements of the plan.   This documentation was completed using Paediatric nurse.  Any transcriptional errors are unintentional.  Orders Placed This Encounter  Procedures   POCT glucose (manual entry)   POCT glycosylated hemoglobin (Hb A1C)     Requested Prescriptions    No prescriptions requested or ordered in this encounter    Return in about 4 months (around 07/15/2024).  Barnie Louder, MD, FACP     [1]  Current Outpatient Medications on File Prior to Visit  Medication Sig Dispense Refill   aspirin  EC 81 MG tablet Take 81 mg by mouth daily.     isosorbide  mononitrate (IMDUR ) 60 MG 24 hr tablet TAKE 1 TABLET BY MOUTH EVERY DAY 90 tablet 3   metoprolol  succinate (TOPROL -XL) 25 MG 24 hr tablet TAKE 1 TABLET (25 MG TOTAL) BY MOUTH DAILY. 90 tablet 0   Multiple Vitamins-Minerals (CENTRUM) tablet Take 1 tablet by mouth daily.      nitroGLYCERIN  (NITROSTAT ) 0.4 MG SL tablet DISSOLVE ONE TABLET UNDER THE TONGUE EVERY 5 MINUTES AS NEEDED FOR CHEST PAIN(IF PAIN PERSIST AFTER 3 DOSES CALL EMS) 75 tablet 0   rosuvastatin  (CRESTOR ) 20 MG tablet TAKE 1 TABLET BY MOUTH EVERY DAY 90 tablet 1   colchicine  0.6 MG tablet 2 tab PO now then 1 tab PO 2 hrs later then 1 tab PO daily for 5 days. (Patient not taking: Reported on 03/17/2024) 8 tablet 0   No current facility-administered medications on file prior to visit.  [2] No Known Allergies  "

## 2024-04-02 ENCOUNTER — Other Ambulatory Visit: Payer: Self-pay | Admitting: Internal Medicine

## 2024-04-02 DIAGNOSIS — I25118 Atherosclerotic heart disease of native coronary artery with other forms of angina pectoris: Secondary | ICD-10-CM

## 2024-04-02 DIAGNOSIS — E1159 Type 2 diabetes mellitus with other circulatory complications: Secondary | ICD-10-CM

## 2024-07-16 ENCOUNTER — Ambulatory Visit: Payer: Self-pay | Admitting: Internal Medicine
